# Patient Record
Sex: Female | Born: 1935 | Race: White | Hispanic: No | Marital: Married | Smoking: Former smoker
Health system: Southern US, Community
[De-identification: ages and names within clinical notes are randomized; demographics above are authoritative.]

## PROBLEM LIST (undated history)

## (undated) DIAGNOSIS — I10 Essential (primary) hypertension: Secondary | ICD-10-CM

## (undated) DIAGNOSIS — E785 Hyperlipidemia, unspecified: Secondary | ICD-10-CM

## (undated) DIAGNOSIS — I739 Peripheral vascular disease, unspecified: Secondary | ICD-10-CM

## (undated) HISTORY — DX: Hyperlipidemia, unspecified: E78.5

## (undated) HISTORY — DX: Peripheral vascular disease, unspecified: I73.9

## (undated) HISTORY — DX: Essential (primary) hypertension: I10

---

## 1998-09-18 ENCOUNTER — Other Ambulatory Visit: Admission: RE | Admit: 1998-09-18 | Discharge: 1998-09-18 | Payer: Self-pay | Admitting: Obstetrics & Gynecology

## 1999-09-30 ENCOUNTER — Other Ambulatory Visit: Admission: RE | Admit: 1999-09-30 | Discharge: 1999-09-30 | Payer: Self-pay | Admitting: Obstetrics and Gynecology

## 2004-10-12 ENCOUNTER — Emergency Department: Payer: Self-pay | Admitting: Emergency Medicine

## 2004-10-12 ENCOUNTER — Other Ambulatory Visit: Payer: Self-pay

## 2005-07-14 ENCOUNTER — Ambulatory Visit: Payer: Self-pay | Admitting: Unknown Physician Specialty

## 2006-09-27 ENCOUNTER — Ambulatory Visit: Payer: Self-pay | Admitting: Cardiology

## 2007-11-01 ENCOUNTER — Ambulatory Visit: Payer: Self-pay | Admitting: Unknown Physician Specialty

## 2008-04-17 ENCOUNTER — Ambulatory Visit: Payer: Self-pay

## 2008-04-29 ENCOUNTER — Ambulatory Visit: Payer: Self-pay | Admitting: Vascular Surgery

## 2008-05-22 ENCOUNTER — Ambulatory Visit: Payer: Self-pay | Admitting: Vascular Surgery

## 2008-05-29 ENCOUNTER — Inpatient Hospital Stay: Payer: Self-pay | Admitting: Vascular Surgery

## 2011-08-21 ENCOUNTER — Emergency Department (HOSPITAL_COMMUNITY): Admit: 2011-08-21 | Payer: Self-pay | Source: Home / Self Care

## 2016-10-05 ENCOUNTER — Other Ambulatory Visit: Payer: Self-pay | Admitting: Internal Medicine

## 2016-10-05 DIAGNOSIS — F4489 Other dissociative and conversion disorders: Secondary | ICD-10-CM

## 2016-10-11 ENCOUNTER — Ambulatory Visit
Admission: RE | Admit: 2016-10-11 | Discharge: 2016-10-11 | Disposition: A | Discharge status: Home / Self Care | Payer: Medicare Other | Source: Ambulatory Visit | Attending: Internal Medicine | Admitting: Internal Medicine

## 2016-10-11 DIAGNOSIS — G319 Degenerative disease of nervous system, unspecified: Secondary | ICD-10-CM | POA: Diagnosis not present

## 2016-10-11 DIAGNOSIS — F4489 Other dissociative and conversion disorders: Secondary | ICD-10-CM

## 2016-10-11 DIAGNOSIS — I6782 Cerebral ischemia: Secondary | ICD-10-CM | POA: Insufficient documentation

## 2016-10-20 ENCOUNTER — Other Ambulatory Visit: Payer: Self-pay | Admitting: Internal Medicine

## 2016-10-20 DIAGNOSIS — R9389 Abnormal findings on diagnostic imaging of other specified body structures: Secondary | ICD-10-CM

## 2016-10-28 ENCOUNTER — Ambulatory Visit
Admission: RE | Admit: 2016-10-28 | Discharge: 2016-10-28 | Disposition: A | Discharge status: Home / Self Care | Payer: Medicare Other | Source: Ambulatory Visit | Attending: Internal Medicine | Admitting: Internal Medicine

## 2016-10-28 DIAGNOSIS — J439 Emphysema, unspecified: Secondary | ICD-10-CM | POA: Diagnosis not present

## 2016-10-28 DIAGNOSIS — R918 Other nonspecific abnormal finding of lung field: Secondary | ICD-10-CM | POA: Insufficient documentation

## 2016-10-28 DIAGNOSIS — I2510 Atherosclerotic heart disease of native coronary artery without angina pectoris: Secondary | ICD-10-CM | POA: Insufficient documentation

## 2016-10-28 DIAGNOSIS — R938 Abnormal findings on diagnostic imaging of other specified body structures: Secondary | ICD-10-CM | POA: Diagnosis present

## 2016-10-28 DIAGNOSIS — R9389 Abnormal findings on diagnostic imaging of other specified body structures: Secondary | ICD-10-CM

## 2016-10-28 DIAGNOSIS — I700 Atherosclerosis of aorta: Secondary | ICD-10-CM | POA: Diagnosis not present

## 2016-11-12 ENCOUNTER — Encounter: Payer: Self-pay | Admitting: Pulmonary Disease

## 2016-11-12 ENCOUNTER — Ambulatory Visit (INDEPENDENT_AMBULATORY_CARE_PROVIDER_SITE_OTHER): Payer: Medicare Other | Admitting: Pulmonary Disease

## 2016-11-12 VITALS — BP 100/63 | HR 69 | Ht 61.0 in | Wt 83.0 lb

## 2016-11-12 DIAGNOSIS — R938 Abnormal findings on diagnostic imaging of other specified body structures: Secondary | ICD-10-CM | POA: Diagnosis not present

## 2016-11-12 DIAGNOSIS — R54 Age-related physical debility: Secondary | ICD-10-CM | POA: Diagnosis not present

## 2016-11-12 DIAGNOSIS — J189 Pneumonia, unspecified organism: Secondary | ICD-10-CM

## 2016-11-12 DIAGNOSIS — Z87891 Personal history of nicotine dependence: Secondary | ICD-10-CM

## 2016-11-12 DIAGNOSIS — J181 Lobar pneumonia, unspecified organism: Secondary | ICD-10-CM

## 2016-11-12 DIAGNOSIS — R9389 Abnormal findings on diagnostic imaging of other specified body structures: Secondary | ICD-10-CM

## 2016-11-12 NOTE — Progress Notes (Signed)
PULMONARY CONSULT NOTE  Requesting MD/Service: Thalia Party, MD Date of initial consultation: 11/12/2016 Reason for consultation: RLL infiltrate  PT PROFILE: 83 y.o. female remote smoker referred for evaluation of RLL infiltrate and concern for possible obstruction mass  DATA: CXR 10/05/16 (report only):  RLL infiltrate. F/U CXR in 3-4 weeks recommended CXR 023/13/18 (report only): NSC. CT chest recommended CT chest 10/28/16: Extensive RLL consolidation with suggestion of RLL bronchial obstruction. Stable biapical pleural scarring and bronchiectatic changes Moderate emphysema   HPI:  As above. Her symptoms started. At the end of 2017 with symptoms of an upper respiratory infection and subsequent dyspnea, cough, sputum production. She has been treated with 3 courses of antibiotics. Her symptoms have improved and her cough has resolved. She lost weight during the first part of this illness but is now regaining some of that weight and feels that she is recovering back to her baseline. Presently, she denies CP, fever, purulent sputum, hemoptysis, LE edema and calf tenderness.    Past Medical History:  Diagnosis Date  . Hyperlipidemia   . Hypertension   . Peripheral vascular disease (HCC)     History reviewed. No pertinent surgical history.  MEDICATIONS: I have reviewed all medications and confirmed regimen as documented  Social History   Social History  . Marital status: Married    Spouse name: N/A  . Number of children: N/A  . Years of education: N/A   Occupational History  . Not on file.   Social History Main Topics  . Smoking status: Former Smoker    Quit date: 08/14/1978  . Smokeless tobacco: Never Used  . Alcohol use No  . Drug use: No  . Sexual activity: Not on file   Other Topics Concern  . Not on file   Social History Narrative  . No narrative on file    Family History  Problem Relation Age of Onset  . Family history unknown: Yes    ROS: No fever,  myalgias/arthralgias, unexplained weight loss or weight gain No new focal weakness or sensory deficits No otalgia, hearing loss, visual changes, nasal and sinus symptoms, mouth and throat problems No neck pain or adenopathy No abdominal pain, N/V/D, diarrhea, change in bowel pattern No dysuria, change in urinary pattern   Vitals:   11/12/16 1004  BP: 100/63  Pulse: 69  SpO2: 98%  Weight: 37.6 kg (83 lb)  Height:  (1.549 m)     EXAM:  Gen: Very thin, not cachectic, No overt respiratory distress HEENT: NCAT, sclera white, oropharynx normal Neck: Supple without LAN, thyromegaly, JVD Lungs: breath sounds diminished in R base with slight bronchial quality, no wheezes, rhonchi, rales Cardiovascular: Reg, no murmurs noted Abdomen: Soft, nontender, normal BS Ext: without clubbing, cyanosis, edema Neuro: CNs grossly intact, motor and sensory intact Skin: Limited exam, no lesions noted  DATA:   No flowsheet data found.  No flowsheet data found.  CXR:  Not available for my review  IMPRESSION:     ICD-9-CM ICD-10-CM   1. Lung consolidation (HCC) 481 J18.1 DG Chest 2 View  2. Pneumonia of right lower lobe due to infectious organism (HCC) 486 J18.1   3. Former smoker, remote V15.82 [phone]   4. Abnormal chest CT 793.2 R93.8   5. Advanced age 20 R36    The noncontrasted CT scan is most consistent with PNA but there is certainly reasonable concern re: airway obstruction. Her improving symptoms are somewhat reassuring however, there are persistent exam findings consistent with RLL  consolidation. If a malignancy were to be found, there would be very limited therapeutic options  PLAN:  Follow up in 4 weeks with repeat chest Xray prio to that visit.  At that time, depending on what the chest Xray shows, we will consider repeat CT scan of chest and possible bronchoscopy   Billy Fischer, MD PCCM service Mobile [phone] Pager [phone] 11/12/2016

## 2016-11-12 NOTE — Patient Instructions (Addendum)
Since you seem to be getting better, we will not undertake any diagnostic studies right now. Follow up with me in 4 weeks with repeat chest Xray. At that time, depending on what the chest Xray shows, we will consider repeat CT scan of chest and possible bronchoscopy to evaluate your airways  Follow up in approximately one month

## 2016-12-15 ENCOUNTER — Encounter: Payer: Self-pay | Admitting: Pulmonary Disease

## 2016-12-15 ENCOUNTER — Ambulatory Visit (INDEPENDENT_AMBULATORY_CARE_PROVIDER_SITE_OTHER): Payer: Medicare Other | Admitting: Pulmonary Disease

## 2016-12-15 ENCOUNTER — Ambulatory Visit
Admission: RE | Admit: 2016-12-15 | Discharge: 2016-12-15 | Disposition: A | Discharge status: Home / Self Care | Payer: Medicare Other | Source: Ambulatory Visit | Attending: Pulmonary Disease | Admitting: Pulmonary Disease

## 2016-12-15 VITALS — BP 146/80 | HR 66 | Ht 61.0 in | Wt 84.0 lb

## 2016-12-15 DIAGNOSIS — J181 Lobar pneumonia, unspecified organism: Secondary | ICD-10-CM | POA: Diagnosis present

## 2016-12-15 DIAGNOSIS — R918 Other nonspecific abnormal finding of lung field: Secondary | ICD-10-CM

## 2016-12-15 NOTE — Patient Instructions (Addendum)
We will obtain a repeat CT scan of chest and review this together at next visit  Follow up 06/11 @ 12:00

## 2016-12-15 NOTE — Progress Notes (Signed)
PULMONARY OFFICE FOLLOW-UP NOTE  Requesting MD/Service: Thalia PartyB Klein, MD Date of initial consultation: 12/15/2016 Reason for consultation: RLL infiltrate  PT PROFILE: 83 y.o. female remote smoker referred for evaluation of RLL infiltrate and concern for possible obstruction mass  DATA: CXR 10/05/16 (report only):  RLL infiltrate. F/U CXR in 3-4 weeks recommended CXR 023/13/18 (report only): NSC. CT chest recommended CT chest 10/28/16: Extensive RLL consolidation with suggestion of RLL bronchial obstruction. Stable biapical pleural scarring and bronchiectatic changes Moderate emphysema CXR 12/15/16: Persistent right lower lobe opacity. When compared to the scout film on CT scan of the chest it appears to be improved but not resolved.   SUBJ:  No new complaints. She denies chest pain, cough, hemoptysis, fevers, weight loss.  OBJ:  Vitals:   12/15/16 0858  BP: (!) 146/80  Pulse: 66  SpO2: 98%  Weight: 84 lb (38.1 kg)  Height: 5\' 1"  (1.549 m)     EXAM:  Gen: Very thin, not cachectic, No overt respiratory distress HEENT: NCAT, sclera white, oropharynx normal Neck: Supple without LAN, thyromegaly, JVD Lungs: Slightly diminished breath sounds in R base, otherwise normal Cardiovascular: Reg, no murmurs noted Abdomen: Soft, nontender, normal BS Ext: without clubbing, cyanosis, edema Neuro: CNs grossly intact, motor and sensory intact Skin: Limited exam, no lesions noted  DATA:   No flowsheet data found.  No flowsheet data found.  CXR:  Not available for my review  IMPRESSION:     ICD-9-CM ICD-10-CM   1. Opacity of lung on imaging study 793.19 R91.8 CT CHEST WO CONTRAST   Persistent opacity in the right lower lobe. It is hard to tell but I think that there has been some improvement over time. Unfortunately, I do not have access to view the previously performed chest x-rays. Therefore, I am comparing the current film to the scout film on the CT scan of the chest.  PLAN:  Repeat  CT of chest without contrast has been ordered Follow-up scheduled 06/11 at noon to review that CT scan and decide on the next course of action   Billy Fischeravid Delvina Mizzell, MD PCCM service Mobile [phone] Pager [phone] 12/15/2016  .Marland Kitchen..Marland Kitchen

## 2017-01-13 ENCOUNTER — Ambulatory Visit
Admission: RE | Admit: 2017-01-13 | Discharge: 2017-01-13 | Disposition: A | Discharge status: Home / Self Care | Payer: Medicare Other | Source: Ambulatory Visit | Attending: Pulmonary Disease | Admitting: Pulmonary Disease

## 2017-01-13 DIAGNOSIS — J439 Emphysema, unspecified: Secondary | ICD-10-CM | POA: Diagnosis not present

## 2017-01-13 DIAGNOSIS — R918 Other nonspecific abnormal finding of lung field: Secondary | ICD-10-CM | POA: Diagnosis present

## 2017-01-13 DIAGNOSIS — J470 Bronchiectasis with acute lower respiratory infection: Secondary | ICD-10-CM | POA: Insufficient documentation

## 2017-01-13 DIAGNOSIS — I700 Atherosclerosis of aorta: Secondary | ICD-10-CM | POA: Insufficient documentation

## 2017-01-17 ENCOUNTER — Encounter: Payer: Self-pay | Admitting: Pulmonary Disease

## 2017-01-17 ENCOUNTER — Ambulatory Visit (INDEPENDENT_AMBULATORY_CARE_PROVIDER_SITE_OTHER): Payer: Medicare Other | Admitting: Pulmonary Disease

## 2017-01-17 VITALS — BP 148/88 | HR 88 | Ht 61.0 in | Wt 85.0 lb

## 2017-01-17 DIAGNOSIS — J479 Bronchiectasis, uncomplicated: Secondary | ICD-10-CM | POA: Diagnosis not present

## 2017-01-17 DIAGNOSIS — R636 Underweight: Secondary | ICD-10-CM

## 2017-01-17 DIAGNOSIS — R918 Other nonspecific abnormal finding of lung field: Secondary | ICD-10-CM | POA: Diagnosis not present

## 2017-01-17 MED ORDER — FLUTTER DEVI: 0 refills | Status: AC

## 2017-01-17 NOTE — Patient Instructions (Signed)
Begin flutter valve - [facility]-4 times per day We will try to obtain a chest percussion vest to be used twice a day for 10-15 minutes per session I encouraged weight gain as discussed Follow-up in 2-3 months with chest x-ray

## 2017-01-18 NOTE — Progress Notes (Signed)
PULMONARY OFFICE FOLLOW-UP NOTE  Requesting MD/Service: Caryn Section, MD Date of initial consultation: 01/18/2017 Reason for consultation: RLL infiltrate  PT PROFILE: 83 y.o. female remote smoker referred for evaluation of RLL infiltrate and concern for possible obstruction mass  DATA: CXR 10/05/16 (report only):  RLL infiltrate. F/U CXR in 3-4 weeks recommended CXR 023/13/18 (report only): [place]. CT chest recommended CT chest 10/28/16: Extensive RLL consolidation with suggestion of RLL bronchial obstruction. Stable biapical pleural scarring and bronchiectatic changes Moderate emphysema CXR 12/15/16: Persistent right lower lobe opacity. When compared to the scout film on CT scan of the chest it appears to be improved but not resolved.   SUBJ:  Here to review recent CT chest. No new complaints. She denies chest pain, cough, hemoptysis, fevers, weight loss.  OBJ:  Vitals:   01/17/17 1425  BP: (!) 148/88  Pulse: 88  SpO2: 97%  Weight: 38.6 kg (85 lb)  Height: _0  (1.549 m)     EXAM:  Gen: Very thin, No overt respiratory distress HEENT: NCAT, sclera white, oropharynx normal Neck: Supple without LAN, thyromegaly, JVD Lungs: Few rhonchi and diminished breath sounds in R base Cardiovascular: Reg, no murmurs noted Abdomen: Soft, nontender, normal BS Ext: without clubbing, cyanosis, edema Neuro: CNs grossly intact, motor and sensory intact Skin: Limited exam, no lesions noted  DATA:   No flowsheet data found.  No flowsheet data found.  CT chest 01/13/17: (reviewed by me) Right lower lobe confluent plugging of cylindrical bronchiectasis, involving all segmental airways except the apical segment. No noncontrast evidence of underlying obstructive process, although bronchoscopy or postcontrast imaging would be more sensitive. Chronic atypical infection (MAC) is considered given milder bronchovascular nodularity in the right upper lobe and lingula. Superimposed pneumonia/consolidation has  improved since 10/28/2016 chest CT.  IMPRESSION:     ICD-10-CM   1. Bronchiectasis without complication ([place]) O67.6 DG Chest 2 View    Ambulatory Referral for DME  2. Pulmonary infiltrate R91.8   3. Underweight R63.6    I do not think that she has endobronchial obstruction but, rather, extensive bronchiectasis in RLL and to a much lesser extent in BUL The RLL consolidation has improved somewhat. She is notably asymptomatic. However, chronic inflammation might be contributing to her tendency to be underweight  She would benefit from aggressive mucus clearance to open up that RLL and to reduce her likelihood of repeated pneumonias and exacerbations of bronchiectasis. She lives with her elderly husband who is not able to perform manual chest percussion. The optimal and only realistic option is chest percussion vest. I strongly believe that this is a necessary therapy to provide her with optimal airway hygiene.   PLAN:  Begin flutter valve 3-4 times per day. Use demonstrated in office during this encounter Chest percussion vest ordered - to be used twice a day for 10-15 minutes per session I encouraged weight gain of 5-10 lbs to provide her with more reserve in event of illness Follow-up in 2-3 months with chest x-ray  Merton Border, MD PCCM service Mobile [phone] Pager [phone] 01/18/2017 8:25 PM   ..Marland Kitchen

## 2017-01-27 ENCOUNTER — Telehealth: Payer: Self-pay | Admitting: Pulmonary Disease

## 2017-01-27 NOTE — Telephone Encounter (Signed)
Kathlene NovemberMike with [employer] ([employer]) called and stated that company has made contact with patient. Spoke with pt's husband who stated that "pt has refused the vest and does not want to even try the vest".    Kathlene NovemberMike with [employer] stated that they could arrange a 30 day trial and if patient didn't see any benefits from it then they would arrange to have the vest picked up at no charge to the patient.   However, per husband, the patient has early Alzheimer's Disease and when "she says no, she means no".    Please advise if you would like to cancel this referral.  Thank you . Rhonda J Cobb

## 2017-03-24 ENCOUNTER — Ambulatory Visit: Payer: Medicare Other | Admitting: Pulmonary Disease

## 2018-06-17 IMAGING — CT CT HEAD W/O CM
2 series · 15 of 30 positions shown, 17 images · non-contrast
Comparison: None.

CLINICAL DATA: Confusion, disoriented for about 1 month

EXAM:
CT HEAD WITHOUT CONTRAST
TECHNIQUE: Contiguous axial images were obtained from the base of the skull
through the vertex without intravenous contrast.

[Series 2: head wo · axial · 0.41mm/px · z∈[-128,-18]mm · 7 of 30 slices shown, 9 images]
[im 4/30  brain]
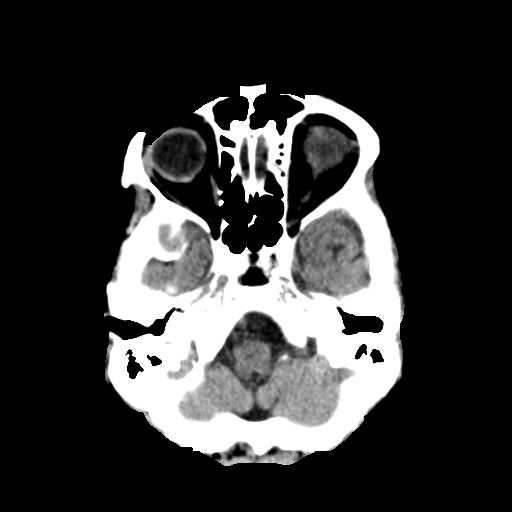
[im 4/30  bone]
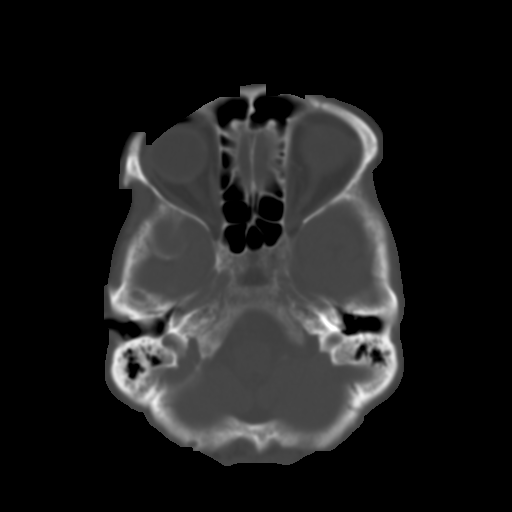
[im 8/30  brain]
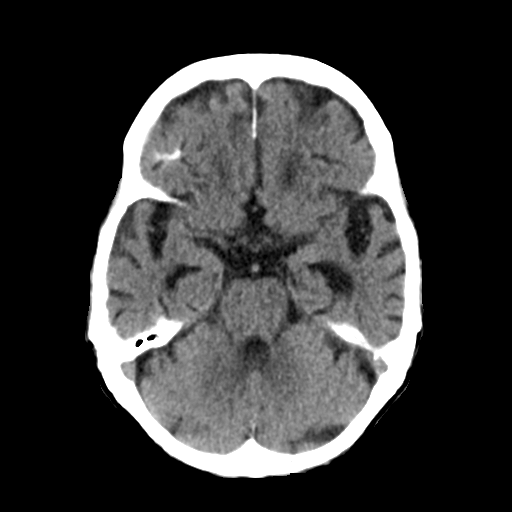
[im 11/30  brain]
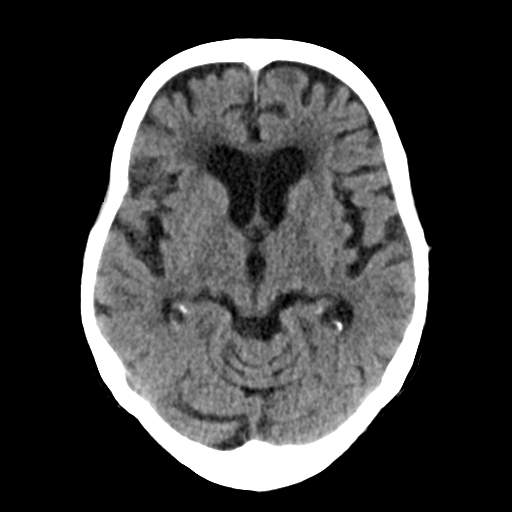
[im 15/30  brain]
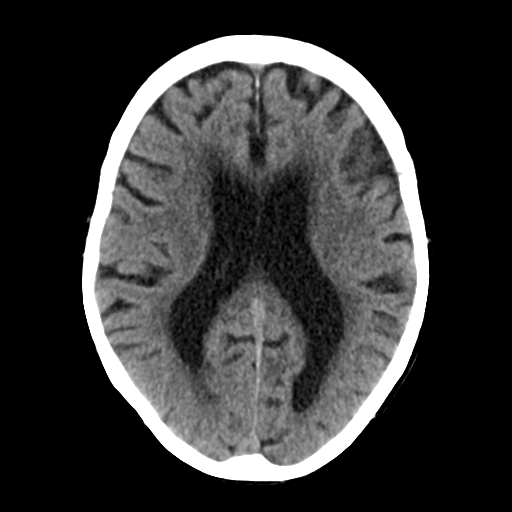
[im 19/30  brain]
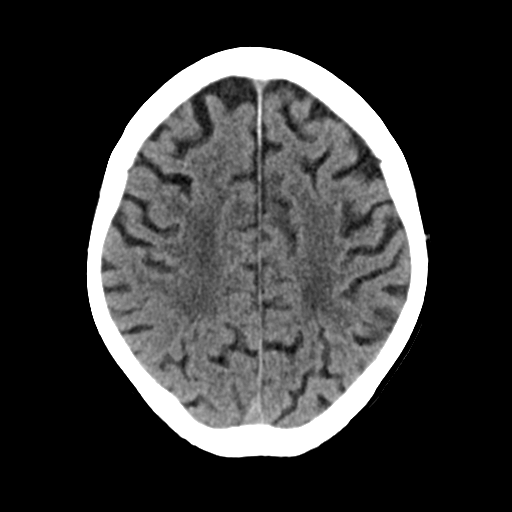
[im 19/30  bone]
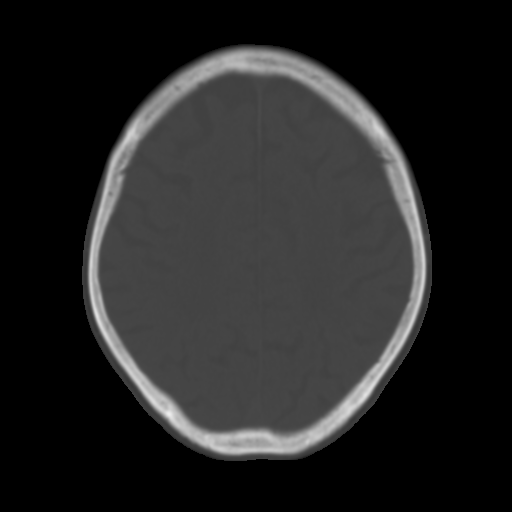
[im 22/30  brain]
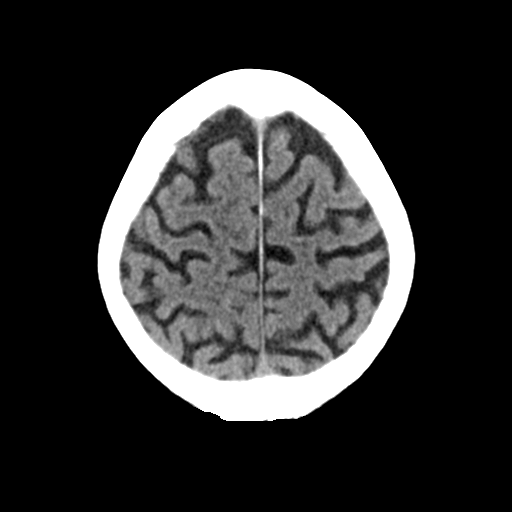
[im 26/30  brain]
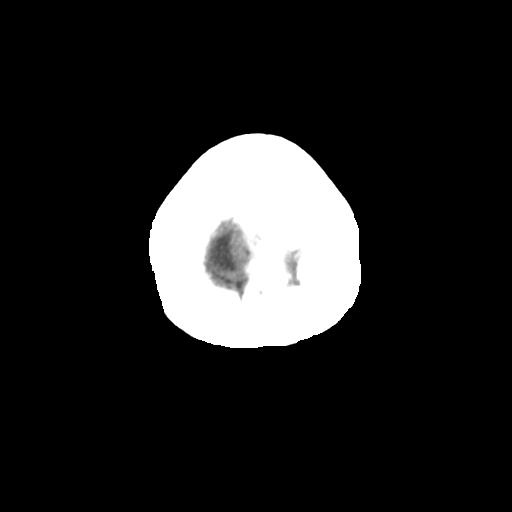

[Series 3: head bone · axial · 0.41mm/px · z∈[-129,-11]mm · 8 of 75 slices shown]
[im 8/75  bone]
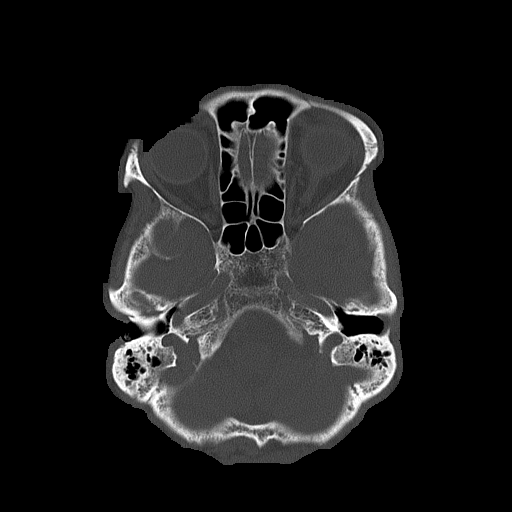
[im 15/75  bone]
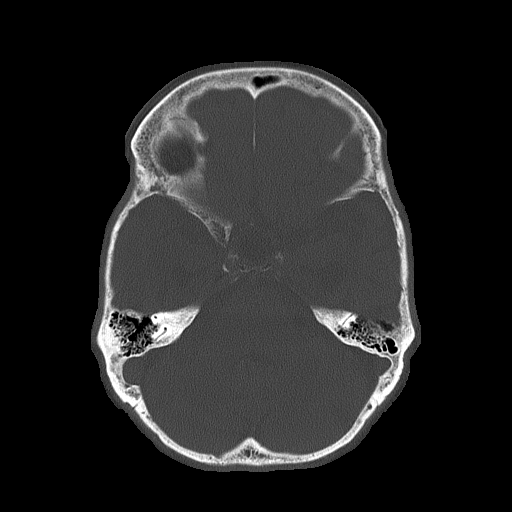
[im 23/75  bone]
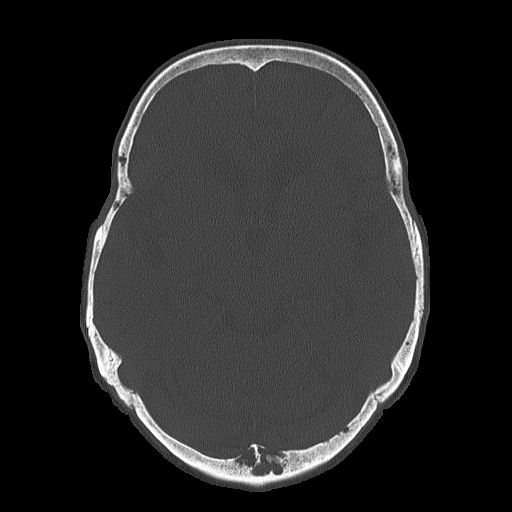
[im 34/75  bone]
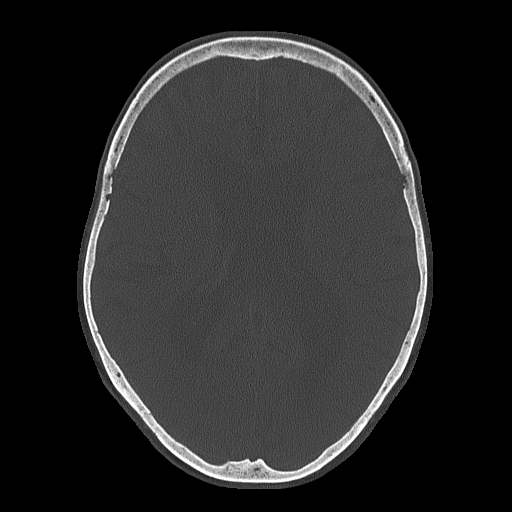
[im 41/75  bone]
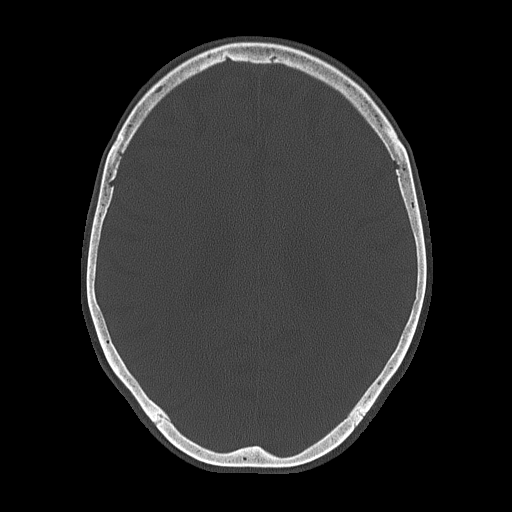
[im 52/75  bone]
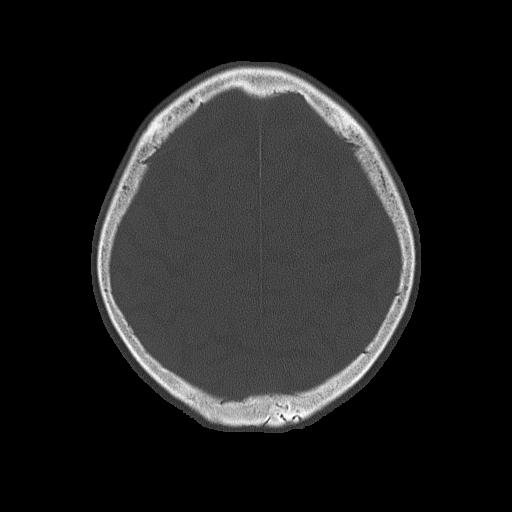
[im 60/75  bone]
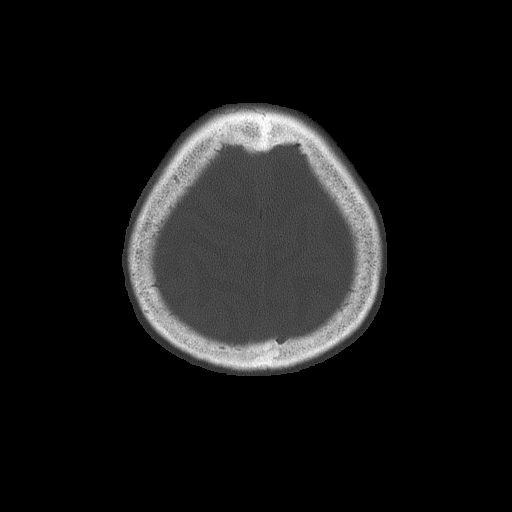
[im 67/75  bone]
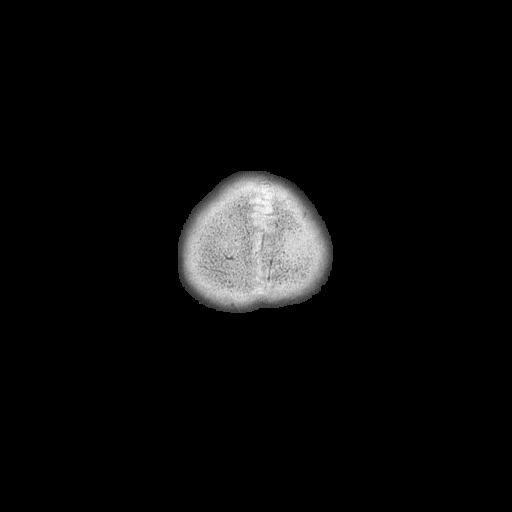

[15 of 30 positions shown; findings below may reference images not displayed]

FINDINGS: Brain: No intracranial hemorrhage, mass effect or midline shift.
Moderate cerebral atrophy. There is mild periventricular and patchy
subcortical white matter decreased attenuation probable due to
chronic small vessel ischemic changes. No acute cortical infarction.
No mass lesion is noted on this unenhanced scan.

Vascular: Atherosclerotic calcifications are noted carotid siphon.

Skull: No skull fracture.

Sinuses/Orbits: No acute findings

Other: None
IMPRESSION: 1. No acute intracranial abnormality. Moderate cerebral atrophy.
There is periventricular and patchy subcortical white matter
decreased attenuation probable due to chronic small vessel ischemic
changes. No definite acute cortical infarction.

## 2019-09-03 ENCOUNTER — Telehealth: Payer: Self-pay

## 2019-09-03 NOTE — Telephone Encounter (Signed)
Communication with patient's son today regarding Hospice and Palliative care services. Provided education regarding the difference in Hospice and Palliative care. Patient's son would like to pursue Palliative care for patient and requested that Dr. Odessa Fleming office be contacted for approval.

## 2019-09-03 NOTE — Telephone Encounter (Signed)
Received phone call from PCP office conirminng that Dr. Graciela Husbands is in agreement with initiaitig palliative care for patient. Patient's son updated

## 2019-09-03 NOTE — Telephone Encounter (Signed)
At the request of patient's son, phone call placed to Dr. Odessa Fleming office to inquire if Dr. Graciela Husbands is in agreement with initiating Palliative care services. Message given to [facility], with call back information.

## 2019-09-04 ENCOUNTER — Telehealth: Payer: Self-pay | Admitting: Adult Health Nurse Practitioner

## 2019-09-04 NOTE — Telephone Encounter (Signed)
Spoke with patient's son [name] regarding Palliative services and we have scheduled a Telephone Consult for 09/11/19 @ 9 AM.

## 2019-09-11 ENCOUNTER — Other Ambulatory Visit: Payer: Medicare Other | Admitting: Adult Health Nurse Practitioner

## 2019-09-11 ENCOUNTER — Other Ambulatory Visit: Payer: Self-pay

## 2019-09-11 DIAGNOSIS — F0281 Dementia in other diseases classified elsewhere with behavioral disturbance: Secondary | ICD-10-CM

## 2019-09-11 DIAGNOSIS — Z515 Encounter for palliative care: Secondary | ICD-10-CM

## 2019-09-11 DIAGNOSIS — G301 Alzheimer's disease with late onset: Secondary | ICD-10-CM

## 2019-09-11 NOTE — Progress Notes (Signed)
Therapist, nutritional Palliative Care Consult Note Telephone: [phone]  Fax: [phone]  PATIENT NAME: Ashlee Briggs DOB: [birth year] MRN: [id number]  PRIMARY CARE PROVIDER:   Lynnea Ferrier, MD  REFERRING PROVIDER:  Lynnea Ferrier, MD [address] [facility] [place],  [place] [address]  RESPONSIBLE PARTY:   Quintasia Theroux, husband [phone] Stephen Turnbaugh, son [phone]  Due to the COVID-19 crisis, this visit was done via telemedicine and it was initiated and consent by this patient and or family. Video-audio (telehealth) contact was unable to be done due to technical barriers from the patient's side.    RECOMMENDATIONS and PLAN:  1.  Advanced care planning.  Patient is a full code.  Have in person appointment to go over in one week and to evaluate patient's baseline.  2.  Alzheimer's dementia. FAST 6d.  Patient is able to ambulate without assistive devices but does require stand by assist at times. Denies falls. She is incontinent of B&B.  She requires assistance with bathing and dressing.  Family is in the process of getting extra help in the home.  Have discussed questions they can ask the agency when bringing an aide into the home to help with a dementia patient.  Patient lives in [place] with her husband.  Family does state that over the past couple of months it has been getting harder to care for her as she is starting to require more assistance and that is why they are pursuing extra help in the home.  Discussed disease progression and what to expect.  Patient does have good days and bad days with her appetite.  Explained that this can be normal with dementia and that forcing the issue could increase agitation.  She does have periods of anxiety/agitation. Family states that she can be "mean" at times when being told what to do. Takes Namenda 10 mg BID.  Was started on Abilify 2mg  daily.  Husband was giving in the morning and this caused  sleepiness.  Tried giving half a tab at night and she was still having daytime sleepiness and unsteady gait.  Husband has stopped giving her this after 2 weeks of not seeing any improvement in the daytime sleepiness and unsteady gait.  Agree for patient's safety that it was probably best not to continue with the Abilify.  Did suggest that if in the future if she needs something for agitation we could try seroquel.  No matter what we try will have to monitor for excessive drowsiness and risk of falls.    3.  Nutrition.  Family reports that patient has always been small but a couple years ago when first found to have bronchiectasis that she lost down to low 80 pounds.  She was started on megace and takes 20 mg daily.  Her weight has been stable around 99 pounds.  Continue to monitor for weight loss and add supplementation if needed.  Palliative care will continue to monitor for symptom management/decline and make recommendations as needed.  Have offered that if need be I can be there when the aide is there to help with any dementia education  I spent 60 minutes providing this consultation,  including time with patient/family, chart review, provider coordination, and documentation. More than 50% of the time in this consultation was spent coordinating communication.   HISTORY OF PRESENT ILLNESS:  [facility]lee [facility]gs is a 86 y.o. year old female with multiple medical problems including late onset Alzheimer's, bronchiectasis, HTN,  PVD, CHF. Palliative Care was asked to help address goals of care.   CODE STATUS: full code  PPS: 60% HOSPICE ELIGIBILITY/DIAGNOSIS: TBD  PHYSICAL EXAM:   Deferred  PAST MEDICAL HISTORY:  Past Medical History:  Diagnosis Date  . Hyperlipidemia   . Hypertension   . Peripheral vascular disease ([place])     SOCIAL HX:  Social History   Tobacco Use  . Smoking status: Former Smoker    Quit date: 08/14/1978    Years since quitting: 41.1  . Smokeless tobacco: Never Used    Substance Use Topics  . Alcohol use: No    ALLERGIES:  Allergies  Allergen Reactions  . Alendronate Other (See Comments)  . Risedronate Other (See Comments)  . Sulfa Antibiotics Other (See Comments)     PERTINENT MEDICATIONS:  Outpatient Encounter Medications as of 09/11/2019  Medication Sig  . Ascorbic Acid (VITAMIN C) 1000 MG tablet Take 1,000 mg by mouth daily.  . carvedilol (COREG) 25 MG tablet Take 25 mg by mouth 2 (two) times daily with a meal.  . Cholecalciferol (D3 HIGH POTENCY) 2000 units CAPS Take 2,000 Units by mouth daily.  . Multiple Vitamin (MULTIVITAMIN) tablet Take 1 tablet by mouth daily.  Marland Kitchen Respiratory Therapy Supplies (FLUTTER) DEVI Use 10-15 times daily  . simvastatin (ZOCOR) 40 MG tablet Take 40 mg by mouth daily.   No facility-administered encounter medications on file as of 09/11/2019.      Marcine Gadway Jenetta Downer, NP

## 2019-09-13 ENCOUNTER — Telehealth: Payer: Self-pay | Admitting: Adult Health Nurse Practitioner

## 2019-09-13 NOTE — Telephone Encounter (Signed)
Returned son's VM.  Setting up in home assistance and had questions about how often the aide should come in. He and his dad having differences of opinion and he wanted more clarity on what would work best. Did express that with a dementia patient consistency is best.If they could have someone come in 5 days a week versus someone coming in 3 days a week his mom may eventually establish a better relationship with the aide. Aide through [facility] is starting on Monday. Encouraged to call with questions and concerns. Prosper Paff K. Garner Nash NP

## 2019-09-19 ENCOUNTER — Other Ambulatory Visit: Payer: Medicare Other | Admitting: Adult Health Nurse Practitioner

## 2019-09-19 ENCOUNTER — Other Ambulatory Visit: Payer: Self-pay

## 2019-09-19 ENCOUNTER — Telehealth: Payer: Self-pay

## 2019-09-19 DIAGNOSIS — F0281 Dementia in other diseases classified elsewhere with behavioral disturbance: Secondary | ICD-10-CM

## 2019-09-19 DIAGNOSIS — Z515 Encounter for palliative care: Secondary | ICD-10-CM

## 2019-09-19 DIAGNOSIS — G301 Alzheimer's disease with late onset: Secondary | ICD-10-CM

## 2019-09-19 NOTE — Telephone Encounter (Signed)
At the direction of Amy NP, phone call placed to Dr. Odessa Fleming office to request an order for speech therapy to provide cognitive eval.

## 2019-09-19 NOTE — Progress Notes (Signed)
[facility] Consult Note Telephone: [phone]  Fax: [phone]  PATIENT NAME: Ashlee Briggs DOB: 84-01-1928 MRN: [id number]  PRIMARY CARE PROVIDER:   Adin Hector, MD  REFERRING PROVIDER:  Adin Hector, MD [facility] [place],  [place] [facility]  RESPONSIBLE PARTY:   Ashlee Briggs, husband [phone] Ashlee [facility]gs, son [phone]      RECOMMENDATIONS and PLAN:  1.  Advanced care planning.  Started discussion on ACP.  Family states that they have a living will.  Left blank MOST form with family to go over and will go over at next visit scheduled in 4 weeks.  2.  Alzheimer's dementia. FAST 6d.  Patient is able to ambulate without assistive devices but does require stand by assist at times. Denies falls. She is incontinent of bladder and occasional incontinence of bowel.  She requires assistance with bathing and dressing.  They have found an aide through [facility] who comes in 5 days a week from 9am to 12pm.  Patient and aide are working well together and this is giving husband and family the help they need and peace of mind to be able to go out and take care of themselves.  They are concerned about keeping her mind stimulated.  She used to go to an adult daycare at [facility] to [employer] and has not had that stimulation for almost a year now.  Have reached out to PCP for referral for home health [facility] to provide memory exercises.  3.  Nutrition.  Patient's weight has been stable around 99 pounds.  Her tastes have changed and she tends to eat better if they start a meal with something sweet.  Encouraged too to offer milk shakes mixed with Ensure or Boost for extra nutritional supplementation.  Will continue to monitor for weight loss.   Palliative care will continue to monitor for symptom management/decline and make recommendations as needed.   I spent 50 minutes providing this consultation,  including time with patient/family, chart review, provider coordination, and documentation . More than 50% of the time in this consultation was spent coordinating communication.   HISTORY OF PRESENT ILLNESS:  Ashlee Briggs is a 86 y.o. year old female with multiple medical problems including late onset Alzheimer's, bronchiectasis, HTN, PVD, CHF. Palliative Care was asked to help address goals of care.   CODE STATUS: see above  PPS: 60% HOSPICE ELIGIBILITY/DIAGNOSIS: TBD  PHYSICAL EXAM:  BP 118/62  HR 76  O2 95% on RA General: NAD, frail appearing, thin Cardiovascular: regular rate and rhythm Pulmonary: lung sounds clear; normal respiratory effort Abdomen: soft, nontender, + bowel sounds Extremities: no edema, no joint deformities Skin: no rashes on exposed Neurological: Weakness; A&O to person and place   PAST MEDICAL HISTORY:  Past Medical History:  Diagnosis Date  . Hyperlipidemia   . Hypertension   . Peripheral vascular disease ([place])     SOCIAL HX:  Social History   Tobacco Use  . Smoking status: Former Smoker    Quit date: 08/14/1978    Years since quitting: 41.1  . Smokeless tobacco: Never Used  Substance Use Topics  . Alcohol use: No    ALLERGIES:  Allergies  Allergen Reactions  . Alendronate Other (See Comments)  . Risedronate Other (See Comments)  . Sulfa Antibiotics Other (See Comments)     PERTINENT MEDICATIONS:  Outpatient Encounter Medications as of 09/19/2019  Medication Sig  . Ascorbic Acid (  VITAMIN C) 1000 MG tablet Take 1,000 mg by mouth daily.  . carvedilol (COREG) 25 MG tablet Take 25 mg by mouth 2 (two) times daily with a meal.  . Cholecalciferol (D3 HIGH POTENCY) 2000 units CAPS Take 2,000 Units by mouth daily.  . Multiple Vitamin (MULTIVITAMIN) tablet Take 1 tablet by mouth daily.  Marland Kitchen Respiratory Therapy Supplies (FLUTTER) DEVI Use 10-15 times daily  . simvastatin (ZOCOR) 40 MG tablet Take 40 mg by mouth daily.   No facility-administered  encounter medications on file as of 09/19/2019.     Ashlee Velarde Marlena Clipper, NP

## 2019-10-18 ENCOUNTER — Other Ambulatory Visit: Payer: Self-pay

## 2019-10-18 ENCOUNTER — Other Ambulatory Visit: Payer: Medicare Other | Admitting: Adult Health Nurse Practitioner

## 2019-10-18 DIAGNOSIS — F02818 Dementia in other diseases classified elsewhere, unspecified severity, with other behavioral disturbance: Secondary | ICD-10-CM

## 2019-10-18 DIAGNOSIS — Z515 Encounter for palliative care: Secondary | ICD-10-CM

## 2019-10-18 DIAGNOSIS — F0281 Dementia in other diseases classified elsewhere with behavioral disturbance: Secondary | ICD-10-CM

## 2019-10-18 NOTE — Progress Notes (Signed)
Therapist, nutritional Palliative Care Consult Note Telephone: [phone]  Fax: [phone]  PATIENT NAME: Ashlee Briggs DOB: 01-15-1928 MRN: [id number]  PRIMARY CARE PROVIDER:   Lynnea Ferrier, MD  REFERRING PROVIDER:  Lynnea Ferrier, MD [address] [facility] [place],  [place] [address]  RESPONSIBLE PARTY:   Ashlee Briggs, husband [phone] Ashlee Briggs, son [phone]    RECOMMENDATIONS and PLAN:  1.  Advanced care planning.  Patient is a DNR.  Went over at [employer] form.  Husband did not want to fill this out today.  Did have a good discussion about what he wanted for his wife including being treated at hospital but not wanting too aggressive or invasive treatment.    2.  Alzheimer's dementia. FAST 6d. Patient is able to ambulate without assistive devices but does require stand by assist at times. Denies falls. She is incontinent of bladder and occasional incontinence of bowel. She requires assistance with bathing and dressing.  They have found 2 aides through [facility] who comes in 5 days a week from 9am to 12pm.  One comes in on M,W,F and the other on T, TH.  This is working out well. The aides will read to her and the patient will discuss the stories with the aide.  She is done working with therapy and husband states that ST has given him some exercises to help get her mind engaged.  Continue supportive care at home.  Patient has not had any falls, infection, or hospital visits since last visit.  Appetite is the same and weight stable.  Palliative care will continue to monitor for symptom management/decline and make recommendations as needed.   I spent 90 minutes providing this consultation,  from 2:00 to 3:30 including time with patient/family, chart review, provider coordination, and documentation. More than 50% of the time in this consultation was spent coordinating communication.   HISTORY OF PRESENT ILLNESS:  Ashlee Briggs is a 86 y.o. year old female with multiple medical problems including late onset Alzheimer's, bronchiectasis, HTN, PVD, CHF. Palliative Care was asked to help address goals of care.   CODE STATUS: DNR  PPS: 60% HOSPICE ELIGIBILITY/DIAGNOSIS: TBD  PHYSICAL EXAM:  BP  122/70  Weight 100 pounds General: NAD, frail appearing, thin Cardiovascular: regular rate and rhythm Pulmonary: lung sounds clear; normal respiratory effort Abdomen: soft, nontender, + bowel sounds GU: no suprapubic tenderness Extremities: no edema, no joint deformities Skin: no rashes on exposed skin Neurological: Weakness; A&O to person and place  PAST MEDICAL HISTORY:  Past Medical History:  Diagnosis Date  . Hyperlipidemia   . Hypertension   . Peripheral vascular disease (HCC)     SOCIAL HX:  Social History   Tobacco Use  . Smoking status: Former Smoker    Quit date: 08/14/1978    Years since quitting: 41.2  . Smokeless tobacco: Never Used  Substance Use Topics  . Alcohol use: No    ALLERGIES:  Allergies  Allergen Reactions  . Alendronate Other (See Comments)  . Risedronate Other (See Comments)  . Sulfa Antibiotics Other (See Comments)     PERTINENT MEDICATIONS:  Outpatient Encounter Medications as of 10/18/2019  Medication Sig  . Ascorbic Acid (VITAMIN C) 1000 MG tablet Take 1,000 mg by mouth daily.  . carvedilol (COREG) 25 MG tablet Take 25 mg by mouth 2 (two) times daily with a meal.  . Cholecalciferol (D3 HIGH POTENCY) 2000 units CAPS Take 2,000 Units  by mouth daily.  . Multiple Vitamin (MULTIVITAMIN) tablet Take 1 tablet by mouth daily.  Marland Kitchen Respiratory Therapy Supplies (FLUTTER) DEVI Use 10-15 times daily  . simvastatin (ZOCOR) 40 MG tablet Take 40 mg by mouth daily.   No facility-administered encounter medications on file as of 10/18/2019.      Ashlee Briggs Jenetta Downer, NP

## 2019-10-29 ENCOUNTER — Telehealth: Payer: Self-pay

## 2019-10-29 ENCOUNTER — Telehealth: Payer: Self-pay | Admitting: Adult Health Nurse Practitioner

## 2019-10-29 NOTE — Telephone Encounter (Signed)
SW received referral from Amy, NP regarding long-term care questions and concerns. SW attempted to contact [name] (patient's son). SW left VM with contact information. Jimmy contacted SW. Discussed situation in that patient's husband was caring for patient but has had a stroke and family realizes that patient and patient's husband are needing additional care support. [name] said that they have a plan for the short-term but would like to know options and process for assisted living. SW discussed process and provided education. SW agreed to also email information. SW provided a list of assisted living facilities in [facility] for family to review, contact and discuss. [name] was appreciative. SW provided contact information for any additional follow-up questions.

## 2019-10-29 NOTE — Telephone Encounter (Signed)
Returned patients message.  His father, patient's husband and primary caregiver, is in the hospital after a CVA.  They do have 9am-6pm caregivers lined up and he or his sister stay with their mother at night.  Had questions about possible placement as his dad post CVA will not be able to take care of her like he used to.  Is interested in placement for both of his parents with his mother needing place with memory care unit.  Discussed having SW contact him with more information.  States that his father will be discharged either today or tomorrow.  Have contacted SW to reach out to the son with more information on what next steps would be for starting the process of placement. Tynslee Bowlds K. Garner Nash  NP

## 2019-11-27 ENCOUNTER — Telehealth: Payer: Self-pay | Admitting: Adult Health Nurse Practitioner

## 2019-11-27 NOTE — Telephone Encounter (Signed)
Spoke with son about appointment scheduled for 11/29/19.  States that his mom and his dad will both be moving into [facility] [address] in [place] within a week.  Son would like to continue palliative services for his mom and start them for his dad at the facility. Cancelled appt on 4/22 and have reached out to palladmin to continue services at [facility] and to start palliative services for his dad as well as he has had a decline since having a stroke about 3 weeks ago Sahaj Bona K. Garner Nash NP

## 2019-12-06 ENCOUNTER — Telehealth: Payer: Self-pay

## 2019-12-06 NOTE — Telephone Encounter (Signed)
At request of patient's son, message sent to [facility] to request for Palliative Care to continue to follow patient at [facility].

## 2020-01-08 ENCOUNTER — Encounter: Payer: Self-pay | Admitting: Internal Medicine

## 2020-01-08 ENCOUNTER — Other Ambulatory Visit: Payer: Self-pay

## 2020-01-08 ENCOUNTER — Non-Acute Institutional Stay: Payer: Medicare Other | Admitting: Internal Medicine

## 2020-01-08 DIAGNOSIS — Z7189 Other specified counseling: Secondary | ICD-10-CM

## 2020-01-08 DIAGNOSIS — Z515 Encounter for palliative care: Secondary | ICD-10-CM

## 2020-01-08 NOTE — Progress Notes (Signed)
01/08/2020 [facility] Palliative Care Consult Note Telephone: [phone]  Fax: [phone]  PATIENT NAME: Ashlee Briggs DOB: Jan 25, 1928 MRN: [id number]  PRIMARY CARE PROVIDER:   Cheryll Cockayne, MD   [place] [address]   [facility] [place], [place] [address]   [phone]   [phone] (Fax)   REFERRING PROVIDER:  Clide Deutscher, NP RESPONSIBLE PARTY: Zenna, Traister (Son) [phone] [facility]). [name] ([phone]. Daisja, Kessinger [phone]  ASSESSMENT / RECOMMENDATIONS:   1. Advance Care Planning: A. Directives: discussed and confirmed with PCG spouse [name]. Patient is a DNR. Form available in facility chart. I uploaded into [facility] EMR.    B. Goals of Care: For patient to be content and happy under facility memory care.  2. Cognitive / Functional status: FAST 7a.   Spouse reports rapid cognitive decline. 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 Currently totally confused. She does recognize her spouse and children. She is unable to participate in facility activities. 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 Spouse notes she monitors the activities of other residents; believes patient thinks she is caring for these residents in some way. Patient repeats the same last words of her short phrases and sentences. Maintains eye contact, pleasant. Staff report element of insomnia. She doesn't take naps during the day. Can exchange some pleasantries but cannot speak on topic.    Sedentary. Able to self-transfer. No falls. Ambulates independently but benefits from standby assist. Doubt she could cognate using walker to steady herself. She is dependent for dressing and hygiene. She is incontinent of bladder and bowel unless toileted q 2 hr. Spouse reports good appetite (improved on Megace) and  stable weight of 100.6l lbs.   3. 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 She has been married greater than 56yrs to [place], who moved to [place] so that he could be closely involved in patient's care. [name] spends majority of the day with her in her memory care unit. Patient has 1 son and 1 daughter both of whom live in [place] and who visit regularly. Grandson age 76 is also a frequent visitor.  4. Follow up Palliative Care Visit: 04/08/2020. Monitor for further signs of decline. F/U [employer] form.  I spent 60 minutes providing this consultation from 8:30am to 9:30am. More than 50% of the time in this consultation was spent coordinating communication.   HISTORY OF PRESENT ILLNESS:  Ashlee Briggs is a 86 y.o.  female with h/o  late onset Alzheimer's, bronchiectasis, HTN, PVD, CHF.   Palliative Care was asked to help address goals of care.   CODE STATUS: DNR  PPS: 50%  HOSPICE ELIGIBILITY/DIAGNOSIS: TBD  PAST MEDICAL HISTORY:  Past Medical History:  Diagnosis Date  . Hyperlipidemia   . Hypertension   . Peripheral vascular disease ([facility])     SOCIAL HX:  Social History   Tobacco Use  . Smoking status: Former Smoker    Quit date: 08/14/1978    Years since quitting: 41.4  . Smokeless tobacco: Never Used  Substance Use Topics  . Alcohol use: No    ALLERGIES:  Allergies  Allergen Reactions  . Alendronate Other (See Comments)  . Risedronate Other (See Comments)  . Sulfa Antibiotics Other (See Comments)  PERTINENT MEDICATIONS:  Outpatient Encounter Medications as of 01/08/2020  Medication Sig  . acetaminophen (TYLENOL) 325 MG tablet Take 650 mg by mouth every 8 (eight) hours as needed.  . ARIPiprazole (ABILIFY) 2 MG tablet Take 2 mg by mouth at bedtime.  . busPIRone (BUSPAR) 7.5 MG tablet Take 7.5 mg by mouth 2 (two) times daily.  . carvedilol (COREG) 12.5 MG tablet Take 12.5 mg by mouth 2 (two) times daily with a meal.   .  Cholecalciferol (D3 HIGH POTENCY) 2000 units CAPS Take 2,000 Units by mouth daily.  . megestrol (MEGACE) 20 MG tablet Take 20 mg by mouth daily.  . melatonin 5 MG TABS Take 5 mg by mouth.  . memantine (NAMENDA) 10 MG tablet Take 10 mg by mouth 2 (two) times daily.  . Multiple Vitamin (MULTIVITAMIN) tablet Take 1 tablet by mouth daily.  . simvastatin (ZOCOR) 40 MG tablet Take 40 mg by mouth daily.  . Ascorbic Acid (VITAMIN C) 1000 MG tablet Take 1,000 mg by mouth daily.  Marland Kitchen Respiratory Therapy Supplies (FLUTTER) DEVI Use 10-15 times daily   No facility-administered encounter medications on file as of 01/08/2020.    Anselm Lis, NP [phone]

## 2020-01-30 ENCOUNTER — Emergency Department (HOSPITAL_COMMUNITY): Payer: Medicare Other

## 2020-01-30 ENCOUNTER — Other Ambulatory Visit: Payer: Self-pay

## 2020-01-30 ENCOUNTER — Observation Stay (HOSPITAL_COMMUNITY)
Admit: 2020-01-30 | Discharge: 2020-01-31 | Disposition: A | Discharge status: Skilled Nursing Facility | Payer: Medicare Other | Attending: Internal Medicine | Admitting: Internal Medicine

## 2020-01-30 DIAGNOSIS — F0390 Unspecified dementia without behavioral disturbance: Secondary | ICD-10-CM | POA: Insufficient documentation

## 2020-01-30 DIAGNOSIS — Z882 Allergy status to sulfonamides status: Secondary | ICD-10-CM | POA: Insufficient documentation

## 2020-01-30 DIAGNOSIS — Z8249 Family history of ischemic heart disease and other diseases of the circulatory system: Secondary | ICD-10-CM | POA: Diagnosis not present

## 2020-01-30 DIAGNOSIS — Z87891 Personal history of nicotine dependence: Secondary | ICD-10-CM | POA: Insufficient documentation

## 2020-01-30 DIAGNOSIS — I739 Peripheral vascular disease, unspecified: Secondary | ICD-10-CM | POA: Insufficient documentation

## 2020-01-30 DIAGNOSIS — I6782 Cerebral ischemia: Secondary | ICD-10-CM | POA: Insufficient documentation

## 2020-01-30 DIAGNOSIS — Z888 Allergy status to other drugs, medicaments and biological substances status: Secondary | ICD-10-CM | POA: Insufficient documentation

## 2020-01-30 DIAGNOSIS — R531 Weakness: Secondary | ICD-10-CM | POA: Insufficient documentation

## 2020-01-30 DIAGNOSIS — E785 Hyperlipidemia, unspecified: Secondary | ICD-10-CM | POA: Insufficient documentation

## 2020-01-30 DIAGNOSIS — R262 Difficulty in walking, not elsewhere classified: Secondary | ICD-10-CM | POA: Diagnosis not present

## 2020-01-30 DIAGNOSIS — R5383 Other fatigue: Secondary | ICD-10-CM | POA: Insufficient documentation

## 2020-01-30 DIAGNOSIS — I10 Essential (primary) hypertension: Secondary | ICD-10-CM | POA: Insufficient documentation

## 2020-01-30 DIAGNOSIS — Z79899 Other long term (current) drug therapy: Secondary | ICD-10-CM | POA: Insufficient documentation

## 2020-01-30 DIAGNOSIS — Z20822 Contact with and (suspected) exposure to covid-19: Secondary | ICD-10-CM | POA: Insufficient documentation

## 2020-01-30 DIAGNOSIS — G9340 Encephalopathy, unspecified: Principal | ICD-10-CM | POA: Insufficient documentation

## 2020-01-30 DIAGNOSIS — R4182 Altered mental status, unspecified: Secondary | ICD-10-CM | POA: Diagnosis present

## 2020-01-30 LAB — COMPREHENSIVE METABOLIC PANEL
ALT: 17 U/L (ref 0–44)
AST: 26 U/L (ref 15–41)
Albumin: 3.6 g/dL (ref 3.5–5.0)
Alkaline Phosphatase: 61 U/L (ref 38–126)
Anion gap: 12 (ref 5–15)
BUN: 10 mg/dL (ref 8–23)
CO2: 22 mmol/L (ref 22–32)
Calcium: 9.4 mg/dL (ref 8.9–10.3)
Chloride: 101 mmol/L (ref 98–111)
Creatinine, Ser: 0.65 mg/dL (ref 0.44–1.00)
GFR calc Af Amer: >60 mL/min (ref >60)
GFR calc non Af Amer: >60 mL/min (ref >60)
Glucose, Bld: 118 mg/dL — ABNORMAL HIGH (ref 70–99)
Potassium: 4.1 mmol/L (ref 3.5–5.1)
Sodium: 135 mmol/L (ref 135–145)
Total Bilirubin: 0.8 mg/dL (ref 0.3–1.2)
Total Protein: 7 g/dL (ref 6.5–8.1)

## 2020-01-30 LAB — CBC WITH DIFFERENTIAL/PLATELET
Abs Immature Granulocytes: 0.03 10*3/uL (ref 0.00–0.07)
Basophils Absolute: 0.1 10*3/uL (ref 0.0–0.1)
Basophils Relative: 1 %
Eosinophils Absolute: 0.1 10*3/uL (ref 0.0–0.5)
Eosinophils Relative: 2 %
HCT: 38 % (ref 36.0–46.0)
Hemoglobin: 12.1 g/dL (ref 12.0–15.0)
Immature Granulocytes: 0 %
Lymphocytes Relative: 15 %
Lymphs Abs: 1.2 10*3/uL (ref 0.7–4.0)
MCH: 30.1 pg (ref 26.0–34.0)
MCHC: 31.8 g/dL (ref 30.0–36.0)
MCV: 94.5 fL (ref 80.0–100.0)
Monocytes Absolute: 0.8 10*3/uL (ref 0.1–1.0)
Monocytes Relative: 10 %
Neutro Abs: 6.1 10*3/uL (ref 1.7–7.7)
Neutrophils Relative %: 72 %
Platelets: 289 10*3/uL (ref 150–400)
RBC: 4.02 MIL/uL (ref 3.87–5.11)
RDW: 12.4 % (ref 11.5–15.5)
WBC: 8.4 10*3/uL (ref 4.0–10.5)
nRBC: 0 % (ref 0.0–0.2)

## 2020-01-30 LAB — I-STAT VENOUS BLOOD GAS, ED
Acid-Base Excess: 0 mmol/L (ref 0.0–2.0)
Bicarbonate: 21.2 mmol/L (ref 20.0–28.0)
Calcium, Ion: 1.04 mmol/L — ABNORMAL LOW (ref 1.15–1.40)
HCT: 34 % — ABNORMAL LOW (ref 36.0–46.0)
Hemoglobin: 11.6 g/dL — ABNORMAL LOW (ref 12.0–15.0)
O2 Saturation: 97 %
Potassium: 3.7 mmol/L (ref 3.5–5.1)
Sodium: 137 mmol/L (ref 135–145)
TCO2: 22 mmol/L (ref 22–32)
pCO2, Ven: 25.1 mmHg — ABNORMAL LOW (ref 44.0–60.0)
pH, Ven: 7.534 — ABNORMAL HIGH (ref 7.250–7.430)
pO2, Ven: 80 mmHg — ABNORMAL HIGH (ref 32.0–45.0)

## 2020-01-30 LAB — URINALYSIS, ROUTINE W REFLEX MICROSCOPIC
Bilirubin Urine: NEGATIVE
Glucose, UA: NEGATIVE mg/dL
Hgb urine dipstick: NEGATIVE
Ketones, ur: 5 mg/dL — AB
Leukocytes,Ua: NEGATIVE
Nitrite: NEGATIVE
Protein, ur: NEGATIVE mg/dL
Specific Gravity, Urine: 1.014 (ref 1.005–1.030)
pH: 7 (ref 5.0–8.0)

## 2020-01-30 LAB — TSH: TSH: 2.654 u[IU]/mL (ref 0.350–4.500)

## 2020-01-30 LAB — SARS CORONAVIRUS 2 BY RT PCR (HOSPITAL ORDER, PERFORMED IN ~~LOC~~ HOSPITAL LAB): SARS Coronavirus 2: NEGATIVE

## 2020-01-30 LAB — SALICYLATE LEVEL: Salicylate Lvl: <7 mg/dL — ABNORMAL LOW (ref 7.0–30.0)

## 2020-01-30 LAB — ACETAMINOPHEN LEVEL: Acetaminophen (Tylenol), Serum: <10 ug/mL — ABNORMAL LOW (ref 10–30)

## 2020-01-30 MED ORDER — SODIUM CHLORIDE 0.9% FLUSH: 3.0000 mL | Freq: Two times a day (BID) | INTRAVENOUS | Status: DC

## 2020-01-30 MED ORDER — SODIUM CHLORIDE 0.9 % IV SOLN: INTRAVENOUS | Status: AC

## 2020-01-30 MED ORDER — ENOXAPARIN SODIUM 40 MG/0.4ML ~~LOC~~ SOLN
40.0000 mg | SUBCUTANEOUS | Status: DC
  Administered 2020-01-31: 40 mg via SUBCUTANEOUS
  Filled 2020-01-30: qty 0.4

## 2020-01-30 NOTE — ED Triage Notes (Signed)
Per EMS- pt here from [facility]. Pt has been weak for 1 week, being treated for a UTI. Since yesterday pt has been favoring left side, left side leaning and unable to walk. Unable to complete a stroke assessment, does not follow commands.

## 2020-01-30 NOTE — ED Notes (Signed)
Pt transported to CT ?

## 2020-01-30 NOTE — ED Notes (Signed)
Family updated at bedside .

## 2020-01-30 NOTE — ED Notes (Signed)
Gilmore Laroche (daughter) [phone] Christne Platts (son)  [phone]

## 2020-01-30 NOTE — ED Provider Notes (Signed)
[facility] EMERGENCY DEPARTMENT Provider Note   CSN: [id number] Arrival date & time: 01/30/20  [id number]     History Chief Complaint  Patient presents with  . Altered Mental Status    Ashlee Briggs is a 86 y.o. female.  86 year old female with past medical history below including hypertension, hyperlipidemia, PVD, dementia who presents with altered mental status.  Daughter reports that the patient has had approximately 1 week of progressively worsening weakness and lethargy at her nursing facility.  When she first started having symptoms, they suspected possible UTI and treated empirically with 3 days of Cipro on 6/16-6/18.  Daughter notes that initially said he seemed to perk up and at first they stopped the medication but then when she declined again they added it back.  She got a dose of Cipro last night.  Daughter states that she has not been talking with her today and she is normally conversant.  She is normally ambulatory with assistance but has not been able to walk due to her weakness.  She seems to be leaning towards the left since yesterday.  Daughter is not aware of any vomiting or diarrhea.  Reportedly had a low-grade fever this morning.  LEVEL 5 CAVEAT DUE TO DEMENTIA AND AMS  The history is provided by a relative and the nursing home.  Altered Mental Status      Past Medical History:  Diagnosis Date  . Hyperlipidemia   . Hypertension   . Peripheral vascular disease (HCC)     There are no problems to display for this patient.   No past surgical history on file.   OB History   No obstetric history on file.     Family History  Family history unknown: Yes    Social History   Tobacco Use  . Smoking status: Former Smoker    Quit date: 08/14/1978    Years since quitting: 41.4  . Smokeless tobacco: Never Used  Substance Use Topics  . Alcohol use: No  . Drug use: No    Home Medications Prior to Admission medications   Medication Sig Start  Date End Date Taking? Authorizing Provider  acetaminophen (TYLENOL) 325 MG tablet Take 650 mg by mouth every 8 (eight) hours as needed.    [provider]  ARIPiprazole (ABILIFY) 2 MG tablet Take 2 mg by mouth at bedtime.    [provider]  Ascorbic Acid (VITAMIN C) 1000 MG tablet Take 1,000 mg by mouth daily.    [provider]  busPIRone (BUSPAR) 7.5 MG tablet Take 7.5 mg by mouth 2 (two) times daily.    [provider]  carvedilol (COREG) 12.5 MG tablet Take 12.5 mg by mouth 2 (two) times daily with a meal.     [provider]  Cholecalciferol (D3 HIGH POTENCY) 2000 units CAPS Take 2,000 Units by mouth daily.    [provider]  megestrol (MEGACE) 20 MG tablet Take 20 mg by mouth daily.    [provider]  melatonin 5 MG TABS Take 5 mg by mouth.    [provider]  memantine (NAMENDA) 10 MG tablet Take 10 mg by mouth 2 (two) times daily.    [provider]  Multiple Vitamin (MULTIVITAMIN) tablet Take 1 tablet by mouth daily.    [provider]  Respiratory Therapy Supplies (FLUTTER) DEVI Use 10-15 times daily 01/17/17   Merwyn Katos, MD  simvastatin (ZOCOR) 40 MG tablet Take 40 mg by mouth daily.  [provider]    Allergies    Alendronate, Risedronate, and Sulfa antibiotics  Review of Systems   Review of Systems  Unable to perform ROS: Dementia    Physical Exam Updated Vital Signs BP (!) 144/58   Pulse 66   Temp 97.8 F (36.6 C)   Resp 17   SpO2 99%   Physical Exam Vitals and nursing note reviewed.  Constitutional:      General: She is not in acute distress.    Appearance: She is well-developed.     Comments: Thin, frail, sleeping  HENT:     Head: Normocephalic and atraumatic.     Nose: Nose normal.  Eyes:     Conjunctiva/sclera: Conjunctivae normal.     Pupils: Pupils are equal, round, and reactive to light.  Cardiovascular:     Rate and Rhythm: Normal rate and  regular rhythm.     Heart sounds: Normal heart sounds. No murmur heard.   Pulmonary:     Effort: Pulmonary effort is normal.     Breath sounds: Normal breath sounds.  Abdominal:     General: Bowel sounds are normal. There is no distension.     Palpations: Abdomen is soft.     Tenderness: There is no abdominal tenderness.  Musculoskeletal:        General: No tenderness.     Cervical back: Neck supple.     Right lower leg: No edema.     Left lower leg: No edema.  Skin:    General: Skin is warm and dry.  Neurological:     Comments: Asleep, arouses with sternal rub and states "leave me alone" while keeping eyes closed; does demonstrate normal strength BUE, could not get pt to cooperate for strength testing LE     ED Results / Procedures / Treatments   Labs (all labs ordered are listed, but only abnormal results are displayed) Labs Reviewed  COMPREHENSIVE METABOLIC PANEL - Abnormal; Notable for the following components:      Result Value   Glucose, Bld 118 (*)    All other components within normal limits  ACETAMINOPHEN LEVEL - Abnormal; Notable for the following components:   Acetaminophen (Tylenol), Serum <10 (*)    All other components within normal limits  SALICYLATE LEVEL - Abnormal; Notable for the following components:   Salicylate Lvl <7.0 (*)    All other components within normal limits  URINALYSIS, ROUTINE W REFLEX MICROSCOPIC - Abnormal; Notable for the following components:   Ketones, ur 5 (*)    All other components within normal limits  SARS CORONAVIRUS 2 BY RT PCR (HOSPITAL ORDER, PERFORMED IN [facility])  URINE CULTURE  TSH  CBC WITH DIFFERENTIAL/PLATELET  CBC WITH DIFFERENTIAL/PLATELET  RAPID URINE DRUG SCREEN, HOSP PERFORMED    EKG None  Radiology DG Chest 2 View  Result Date: 01/30/2020 CLINICAL DATA:  Altered mental status EXAM: CHEST - 2 VIEW COMPARISON:  12/15/2016, CT chest, 01/13/2017 FINDINGS: The heart size and mediastinal  contours are within normal limits. Branching opacity of the right lower lobe is generally similar in appearance to prior radiographs and CT, which demonstrated extensive bronchoceles and nodular airspace opacity. The visualized skeletal structures are unremarkable. IMPRESSION: 1. Branching opacity of the right lower lobe is generally similar in appearance to prior radiographs and CT, which demonstrated extensive bronchoceles and nodular airspace opacity most consistent with allergic bronchopulmonary aspergillosis. 2.  There is no acutely superimposed airspace opacity. Electronically Signed   By: Trinna Post  Laqueta Carina M.D.   On: 01/30/2020 11:05   CT Head Wo Contrast  Result Date: 01/30/2020 CLINICAL DATA:  Altered mental status EXAM: CT HEAD WITHOUT CONTRAST TECHNIQUE: Contiguous axial images were obtained from the base of the skull through the vertex without intravenous contrast. COMPARISON:  2018 FINDINGS: Brain: There is no acute intracranial hemorrhage, mass effect, or edema. Gray-white differentiation is preserved. There is no extra-axial fluid collection. Prominence of the ventricles and sulci reflects generalized parenchymal volume loss, which has increased. Relative prominence of ventricles is likely on an ex vacuo basis. Confluent hypoattenuation in the supratentorial white matter is nonspecific but probably reflects advanced chronic microvascular ischemic changes. Vascular: There is atherosclerotic calcification at the skull base. Skull: Calvarium is unremarkable. Sinuses/Orbits: No acute finding. Other: None. IMPRESSION: No acute intracranial abnormality. Advanced chronic microvascular ischemic changes. Electronically Signed   By: Macy Mis M.D.   On: 01/30/2020 13:21    Procedures Procedures (including critical care time)  Medications Ordered in ED Medications - No data to display  ED Course  I have reviewed the triage vital signs and the nursing notes.  Pertinent labs & imaging results that  were available during my care of the patient were reviewed by me and considered in my medical decision making (see chart for details).    MDM Rules/Calculators/A&P                          Vs reassuring, no focal areas of tenderness. DDx is broad and includes infection, electrolyte disturbance such as hyponatremia, dehydration, intracranial process.  Lab work shows normal UA, reassuring CMP and CBC, negative COVID-19, normal ammonia level, normal TSH, negative Tylenol and salicylate levels.  Chest x-ray shows chronic changes similar to previous.  Head CT negative acute.  After several hours in the ED, the patient remains sleepy/somnolent and her mental status has not improved.  Given the significant change from her baseline, discussed admission for further work-up with internal medicine teaching service.  Daughter in agreement with plan. Final Clinical Impression(s) / ED Diagnoses Final diagnoses:  Altered mental status, unspecified altered mental status type    Rx / DC Orders ED Discharge Orders    None       Alonso Gapinski, Wenda Overland, MD 01/30/20 [phone]

## 2020-01-30 NOTE — H&P (Signed)
Date: 01/30/2020               Patient Name:  Ashlee Briggs MRN: [id number]  DOB: [birth year] Age / Sex: 86 y.o., female   PCP: Adin Hector, MD         Medical Service: Internal Medicine Teaching Service         Attending Physician: Dr. Aldine Contes, MD    First Contact: Dr. Gilford Rile Pager: [phone]  Second Contact: Dr. Sharon Seller Pager: [phone]       After Hours (After 5p/  First Contact Pager: [phone]  weekends / holidays): Second Contact Pager: [phone]   Chief Complaint: Altered Mental Status  History of Present Illness:  [name] is a 86 y/o female, with a PMH of dementia, HLD, HTN, and PVD, who presents to [facility] with altered mental status. History was obtained, with some assistance, by the patient and her daughter, as well as chart review. Patient has been a resident at [facility] for 6 weeks. Patient does have a history of dementia and is typically oriented to self, and have conversations that are typically tangential. Over the past week, patient has been getting increasingly lethargic and weaker in her presentation. Patient went from being able with assistance of her walker and help from staff, to being to weak to ambulate. Initially, Ms. Coughlin was treated empirically with a three day dose of cipro with initial improvement in her symptoms.But yesterday, she was noted to be leaning towards the left. Per her daughter, the patient has not been acting in her usual state of mind, but has been improving in mentation over the past hour. Patient denies nausea, vomiting, chest pain, abdominal pain. Daughter states that patient is incontinent at baseline.   Meds:  Current Meds  Medication Sig  . acetaminophen (TYLENOL) 325 MG tablet Take 650 mg by mouth every 8 (eight) hours as needed for moderate pain.   . ARIPiprazole (ABILIFY) 2 MG tablet Take 2 mg by mouth at bedtime.  . busPIRone (BUSPAR) 7.5 MG tablet Take 7.5 mg by mouth 2 (two) times daily as needed (anxiety).     . carvedilol (COREG) 12.5 MG tablet Take 12.5 mg by mouth 2 (two) times daily with a meal.   . Cholecalciferol (VITAMIN D) 50 MCG (2000 UT) tablet Take 2,000 Units by mouth every evening.  . megestrol (MEGACE) 20 MG tablet Take 20 mg by mouth daily.  . melatonin 5 MG TABS Take 10 mg by mouth at bedtime.   . memantine (NAMENDA) 10 MG tablet Take 10 mg by mouth 2 (two) times daily.  . Multiple Vitamin (MULTIVITAMIN) tablet Take 1 tablet by mouth daily.  . simvastatin (ZOCOR) 40 MG tablet Take 40 mg by mouth every evening.     Allergies: Allergies as of 01/30/2020 - Review Complete 01/30/2020  Allergen Reaction Noted  . Alendronate Other (See Comments) 05/23/2014  . Risedronate Other (See Comments) 05/23/2014  . Sulfa antibiotics Other (See Comments) 05/23/2014   Past Medical History:  Diagnosis Date  . Hyperlipidemia   . Hypertension   . Peripheral vascular disease ([place])     Family History:  HTN: In patient's mother and father DM: None Cancer: Patient with colon cancer  Social History:  Lives at memory unit, husband in assisted living. Per daughter denies ETOH, Tobacco, or drug use.   Review of Systems: A complete ROS was negative except as per HPI.   Physical Exam: Blood pressure (!) 166/62, pulse 82, temperature 97.8  F (36.6 C), resp. rate 20, SpO2 94 %. Physical Exam Constitutional:      General: She is not in acute distress.    Appearance: Normal appearance. She is not ill-appearing.     Comments: Alert to self, able to intermittently follow commands. No acute distress, resting comfortably in bed.   Cardiovascular:     Rate and Rhythm: Normal rate and regular rhythm.     Pulses: Normal pulses.     Heart sounds: Normal heart sounds. No murmur heard.  No friction rub. No gallop.   Pulmonary:     Effort: Pulmonary effort is normal.     Breath sounds: Normal breath sounds. No wheezing, rhonchi or rales.  Abdominal:     General: Abdomen is flat. Bowel sounds are  normal.     Palpations: There is no mass.     Tenderness: There is no abdominal tenderness. There is no guarding or rebound.  Musculoskeletal:     Comments: Unable to follow commands for bilateral lower extremities, but able to lift both lower extremities off the bed.  Strength 5/5 in the upper extremities bilaterally.   Neurological:     Mental Status: She is alert.  Unable to follow most CN examination, CN VII intact bilaterally, CN XI Intact, PERRL, CN V intact bilaterally.    EKG: personally reviewed my interpretation is sinus rhythm with no ischemic changes.   CXR: personally reviewed my interpretation is opacity of the RLL is generally similar to previous to prior radiographs and CT.   Assessment & Plan by Problem: Active Problems:   Acute encephalopathy  [name] is  86 y/o female, HTN, HLD, PVD, who was seen in the ED for AMS and admitted for further workup.   Altered Mental Status:  Ms. Spizzirri is a pleasant patient with dementia, htn, pvd. Her workup so far has not showed metabolic derangements/electrolyte abnormalities. Her TSH is negative. Her liver enzymes are WNL. She is able to protect her airway, handling secretions well. She was recently treated with cipro a week ago for possible UTI, there was no urinalysis and culture at the facility, but she was sluggish and her urine was malodorous. She has no leukocytosis, fevers, tachycardia or other signs pointing towards an infectious etiology. She has had no trauma or falls during her lodging at her facility. Structurally, her CT scan shows chronic changes, but no acute etiology. For the parts of the neuro examination she could participate in, she had full strength and perception and cranial nerves were in tact, making stroke less likely. Her salicylate and acetaminophen levels were negative, but she was receiving Abilify at the facility, which was supposed to be halted because she appeared drowsy. While this may be a contributing  factor to her presentation, she would have presented sooner than 6 weeks later. Will undergo a rapid UDS to r/o other toxic causes. She has been having difficulty with sleep at her facility as well and the daughter does endorse "reverse cycling." Overall, patient presenting with labs and imaging within normal limits, will continue further workup and admit to inpatient for observation.  - VBG - UDS  -  Delirium Precautions  - Cardiac monitoring - Soft diet - OT and PT eval and treat  - Vital signs  Hypertension:  - Holding medications   Dispo: Admit patient to Inpatient with expected length of stay greater than 2 midnights.  Signed: Dolan Amen, MD 01/30/2020, 3:53 PM  Pager: [phone] After 5pm on weekdays and 1pm on  weekends: On Call pager: [phone]

## 2020-01-30 NOTE — ED Notes (Signed)
Patient is undress in a gown on monitor.patient has a purwick on clean chux under pt.

## 2020-01-31 DIAGNOSIS — R4182 Altered mental status, unspecified: Secondary | ICD-10-CM | POA: Diagnosis not present

## 2020-01-31 DIAGNOSIS — G9340 Encephalopathy, unspecified: Secondary | ICD-10-CM | POA: Diagnosis not present

## 2020-01-31 LAB — CBC
HCT: 34 % — ABNORMAL LOW (ref 36.0–46.0)
Hemoglobin: 10.7 g/dL — ABNORMAL LOW (ref 12.0–15.0)
MCH: 29.7 pg (ref 26.0–34.0)
MCHC: 31.5 g/dL (ref 30.0–36.0)
MCV: 94.4 fL (ref 80.0–100.0)
Platelets: 233 10*3/uL (ref 150–400)
RBC: 3.6 MIL/uL — ABNORMAL LOW (ref 3.87–5.11)
RDW: 12.3 % (ref 11.5–15.5)
WBC: 7.8 10*3/uL (ref 4.0–10.5)
nRBC: 0 % (ref 0.0–0.2)

## 2020-01-31 LAB — COMPREHENSIVE METABOLIC PANEL
ALT: 14 U/L (ref 0–44)
AST: 23 U/L (ref 15–41)
Albumin: 2.9 g/dL — ABNORMAL LOW (ref 3.5–5.0)
Alkaline Phosphatase: 54 U/L (ref 38–126)
Anion gap: 9 (ref 5–15)
BUN: 9 mg/dL (ref 8–23)
CO2: 20 mmol/L — ABNORMAL LOW (ref 22–32)
Calcium: 8.1 mg/dL — ABNORMAL LOW (ref 8.9–10.3)
Chloride: 107 mmol/L (ref 98–111)
Creatinine, Ser: 0.66 mg/dL (ref 0.44–1.00)
GFR calc Af Amer: >60 mL/min (ref >60)
GFR calc non Af Amer: >60 mL/min (ref >60)
Glucose, Bld: 92 mg/dL (ref 70–99)
Potassium: 3.7 mmol/L (ref 3.5–5.1)
Sodium: 136 mmol/L (ref 135–145)
Total Bilirubin: 0.9 mg/dL (ref 0.3–1.2)
Total Protein: 5.8 g/dL — ABNORMAL LOW (ref 6.5–8.1)

## 2020-01-31 MED ORDER — SODIUM CHLORIDE 0.9 % IV BOLUS
500.0000 mL | Freq: Once | INTRAVENOUS | Status: AC
  Administered 2020-01-31: 500 mL via INTRAVENOUS

## 2020-01-31 NOTE — ED Notes (Signed)
Provided pt with sandwich bag, drinks and snack.

## 2020-01-31 NOTE — Discharge Instructions (Signed)
To Ms. Scarpelli,  It was a pleasure working with you during your stay at [facility]. You were admitted for altered mental status. We watched you overnight, and ordered head imaging and labs, which came back negative. Please hold off on your Abilify as well as ciprofloxacin. Please go to the nearest emergency department if you experience a sudden change in your mental status, slurred speech, or sudden weakness.     Delirium Delirium is a state of mental confusion. It comes on quickly and causes significant changes in a person's thinking and behavior. People with delirium usually have trouble paying attention to what is going on or knowing where they are. They may become very withdrawn or very emotional and unable to sit still. They may even see or feel things that are not there (hallucinations). Delirium is a sign of a serious underlying medical condition. What are the causes? Delirium occurs when something suddenly affects the signals that the brain sends out. Brain signals can be affected by anything that puts severe stress on the body and brain and causes brain chemicals to be out of balance. The most common causes of delirium include:  Infections. These may be bacterial, viral, fungal, or protozoal.  Medicines. These include many over-the-counter and prescription medicines.  Recreational drugs.  Substance withdrawal. This occurs with sudden discontinuation of alcohol, certain medicines, or recreational drugs.  Surgery and anesthesia.  Sudden vascular events, such as stroke and brain hemorrhage.  Other brain disorders, such as migraines, tumors, seizures, and physical head trauma.  Metabolic disorders, such as kidney or liver failure.  Low blood oxygen (anoxia). This may occur with lung disease, cardiac arrest, or carbon monoxide poisoning.  Hormone imbalances (endocrinopathies), such as an overactive thyroid (hyperthyroidism) or underactive thyroid (hypothyroidism).  Vitamin  deficiencies. What increases the risk? The following factors may make someone more likely to develop this condition.  Being a child.  Being an older person.  Living alone.  Having vision loss or hearing loss.  Having an existing brain disease, such as dementia.  Having long-lasting (chronic) medical conditions, such as heart disease.  Being hospitalized for long periods of time. What are the signs or symptoms? Delirium starts with a sudden change in a person's thinking or behavior. Symptoms include:  Not being able to stay awake (drowsiness) or pay attention.  Being confused about places, time, and people.  Forgetfulness.  Having extreme energy levels. These may be low or high.  Changes in sleep patterns.  Extreme mood swings, such as sudden anger or anxiety.  Focusing on things or ideas that are not important.  Rambling and senseless talking.  Difficulty speaking, understanding speech, or both.  Hallucinations.  Tremor or unsteady gait. Symptoms come and go (fluctuate) over time, and they are often worse at the end of the day. How is this diagnosed? People with delirium may not realize that they have the condition. Often, a family member or health care provider is the first person to notice the changes. This condition may be diagnosed based on a physical exam, health history, and tests.  The health care provider will obtain a detailed history. This may include questions about: ? Current symptoms. ? Medical issues. ? Medicines. ? Recreational drug use.  The health care provider will perform a mental status examination by: ? Asking questions to check for confusion. ? Watching for abnormal behavior.  The health care provider may also order lab tests or additional studies to determine the cause of the delirium. How is  this treated? Treatment of delirium depends on the cause and severity. Delirium usually goes away within days or weeks of treating the underlying  cause. In the meantime, do not leave the person alone because he or she may accidentally cause self-harm. This condition may be treated with supportive care, such as:  Increased light during the day and decreased light at night.  Low noise level.  Uninterrupted sleep.  A regular daily schedule.  Clocks and calendars to help with orientation.  Familiar objects, including the person's pictures and clothing.  Frequent visits from familiar family and friends.  A healthy diet.  Gentle exercise. In more severe cases of delirium, medicine may be prescribed to help the person keep calm and think more clearly. Follow these instructions at home:  Continue supportive care as told by a health care provider.  Over-the-counter and prescription medicines should be taken only as told by a health care provider.  Ask a health care provider before using herbs or supplements.  Do not use alcohol or recreational drugs.  Keep all follow-up visits as told by a health care provider. This is important. Contact a health care provider if:  Symptoms do not get better or they become worse.  New symptoms of delirium develop.  Caring for the person at home does not seem safe.  Eating, drinking, or communicating stops.  There are side effects of medicines, such as changes in sleep patterns, dizziness, weight gain, restlessness, movement changes, or tremors. Get help right away if:  Serious thoughts occur about self-harm or about hurting others.  There are serious side effects of medicine, such as: ? Swelling of the face, lips, tongue, or throat. ? Fever, confusion, muscle spasms, or seizures. Summary  Delirium is a state of mental confusion. It comes on quickly and causes significant changes in a person's thinking and behavior.  Delirium is a sign of a serious underlying medical condition.  Certain medical conditions or a long hospital stay may increase the risk of developing  delirium.  Treatment of delirium involves treating the underlying cause and providing supportive treatments, such as a calm and familiar environment. This information is not intended to replace advice given to you by your health care provider. Make sure you discuss any questions you have with your health care provider. Document Revised: 03/16/2018 Document Reviewed: 03/16/2018 Elsevier Patient Education  [place].

## 2020-01-31 NOTE — Care Management CC44 (Signed)
Condition Code 44 Documentation Completed  Patient Details  Name: Ashlee Briggs MRN: [id number] Date of Birth: November 20, 1927   Condition Code 44 given:  (P) Yes Patient signature on Condition Code 44 notice:  (P)  (Patient unable to sign letter due to medical condition. Letter explained in detail to spouse; all questions answered with understanding) Documentation of 2 MD's agreement:  (P) Yes Code 44 added to claim:  (P) Yes    Cherrie Distance, RN 01/31/2020, 10:15 AM

## 2020-01-31 NOTE — ED Notes (Signed)
Lunch Tray Ordered @ 1052.  

## 2020-01-31 NOTE — Care Management Obs Status (Signed)
MEDICARE OBSERVATION STATUS NOTIFICATION   Patient Details  Name: Ashlee Briggs MRN: [id number] Date of Birth: [birth year]   Medicare Observation Status Notification Given:  Other (see comment) (patient unable to sign due to medical condition. Letter explained in detail to her spouse Ashlee Briggs; all questions answered)    Cherrie Distance, RN 01/31/2020, 9:58 AM

## 2020-01-31 NOTE — ED Notes (Signed)
Full bed change with brief completed. Rounding MD team at bedside.

## 2020-01-31 NOTE — Care Management (Signed)
    Durable Medical Equipment  (From admission, onward)         Start     Ordered   01/31/20 1301  For home use only DME lightweight manual wheelchair with seat cushion  Once       Comments: Patient suffers from Alzheimer's Dementia which impairs their ability to perform daily activities like dressing and grooming in the home.  A cane, crutch, or walker will not resolve  issue with performing activities of daily living. A wheelchair will allow patient to safely perform daily activities. Patient is not able to propel themselves in the home using a standard weight wheelchair due to arm weakness, endurance, and general weakness. Patient can self propel in the lightweight wheelchair. Length of need Lifetime. Accessories: elevating leg rests (ELRs), wheel locks, extensions and anti-tippers.   01/31/20 1302

## 2020-01-31 NOTE — ED Notes (Signed)
Pt. Repositioned in bed. Yellow socks and yellow bracelet placed on pt. Pt near nurses station.

## 2020-01-31 NOTE — Discharge Summary (Signed)
Name: Ashlee Briggs MRN: [id number] DOB: October 05, 1927 86 y.o. PCP: Lynnea Ferrier, MD  Date of Admission: 01/30/2020  9:07 AM Date of Discharge: 01/31/2020 Attending Physician: Earl Lagos, MD  Discharge Diagnosis: 1. Acute Encephalopathy 2. Hypertension 3. Hyperlipidemia  Discharge Medications: Allergies as of 01/31/2020      Reactions   Alendronate Other (See Comments)   Risedronate Other (See Comments)   Sulfa Antibiotics Other (See Comments)      Medication List    STOP taking these medications   ARIPiprazole 2 MG tablet Commonly known as: ABILIFY     TAKE these medications   acetaminophen 325 MG tablet Commonly known as: TYLENOL Take 650 mg by mouth every 8 (eight) hours as needed for moderate pain.   busPIRone 7.5 MG tablet Commonly known as: BUSPAR Take 7.5 mg by mouth 2 (two) times daily as needed (anxiety).   carvedilol 12.5 MG tablet Commonly known as: COREG Take 12.5 mg by mouth 2 (two) times daily with a meal.   Flutter Devi Use 10-15 times daily   megestrol 20 MG tablet Commonly known as: MEGACE Take 20 mg by mouth daily.   melatonin 5 MG Tabs Take 10 mg by mouth at bedtime.   memantine 10 MG tablet Commonly known as: NAMENDA Take 10 mg by mouth 2 (two) times daily.   multivitamin tablet Take 1 tablet by mouth daily.   simvastatin 40 MG tablet Commonly known as: ZOCOR Take 40 mg by mouth every evening.   Vitamin D 50 MCG (2000 UT) tablet Take 2,000 Units by mouth every evening.       Disposition and follow-up:   Ashlee Briggs was discharged from [facility] in Stable condition.  At the hospital follow up visit please address:  1. Altered Mental Status:   - Hold Abilify   - Avoid flouroquinolones for UTI due to side effect profile.   2. Hypertenison:   - Continue Coreg 12.5 mg two times daily, monitor blood pressure 3. Hyperlipidemia:   - Continue Zocor 40 mg  2.  Labs / imaging needed at time of  follow-up: None  3.  Pending labs/ test needing follow-up: None  Follow-up Appointments:   Hospital Course by problem list: 1. Acute Encephalopathy:  Patient arrived from [facility] unit with altered mental status. During her emergency department course, her labs came back within normal limits. Her CT head showed advanced chronic microvascular ischemic changes, with no signs of bleeding or trauma. When evaluated by the admitting team, patient's daughter was at bedside, and stated that her mother was returning to bedside. Patient was alert to self, and able to follow commands intermittently, which is baseline per her daughter. Patient was observed overnight and discharged in stable condition.   2. Hypertension:  Patient was seen in the emergency department for altered mental status. Her medications were held during admission. Patient was observed overnight, and discharged in stable condition back on her home medications.   3. Hyperlipidemia:  Patient was observed in the emergency department for acute encephalopathy. She was discharged in stable condition back on her home medications.   Discharge Vitals:   BP (!) 121/54 (BP Location: Left Arm)   Pulse 67   Temp 98 F (36.7 C) (Axillary)   Resp 16   SpO2 99%   Pertinent Labs, Studies, and Procedures:  CBC Latest Ref Rng & Units 01/31/2020 01/30/2020 01/30/2020  WBC 4.0 - 10.5 K/uL 7.8 - 8.4  Hemoglobin 12.0 - 15.0 g/dL  10.7(L) 11.6(L) 12.1  Hematocrit 36 - 46 % 34.0(L) 34.0(L) 38.0  Platelets 150 - 400 K/uL 233 - 289   BMP Latest Ref Rng & Units 01/31/2020 01/30/2020 01/30/2020  Glucose 70 - 99 mg/dL 92 - 118(H)  BUN 8 - 23 mg/dL 9 - 10  Creatinine 0.44 - 1.00 mg/dL 0.66 - 0.65  Sodium 135 - 145 mmol/L 136 137 135  Potassium 3.5 - 5.1 mmol/L 3.7 3.7 4.1  Chloride 98 - 111 mmol/L 107 - 101  CO2 22 - 32 mmol/L 20(L) - 22  Calcium 8.9 - 10.3 mg/dL 8.1(L) - 9.4   Urinalysis    Component Value Date/Time   COLORURINE YELLOW  01/30/2020 1105   APPEARANCEUR CLEAR 01/30/2020 1105   LABSPEC 1.014 01/30/2020 1105   PHURINE 7.0 01/30/2020 1105   GLUCOSEU NEGATIVE 01/30/2020 1105   HGBUR NEGATIVE 01/30/2020 1105   BILIRUBINUR NEGATIVE 01/30/2020 1105   KETONESUR 5 (A) 01/30/2020 1105   PROTEINUR NEGATIVE 01/30/2020 1105   NITRITE NEGATIVE 01/30/2020 1105   LEUKOCYTESUR NEGATIVE 01/30/2020 1105   EXAM: CT HEAD WITHOUT CONTRAST  TECHNIQUE: Contiguous axial images were obtained from the base of the skull through the vertex without intravenous contrast.  COMPARISON:  2018  FINDINGS: Brain: There is no acute intracranial hemorrhage, mass effect, or edema. Gray-white differentiation is preserved. There is no extra-axial fluid collection. Prominence of the ventricles and sulci reflects generalized parenchymal volume loss, which has increased. Relative prominence of ventricles is likely on an ex vacuo basis. Confluent hypoattenuation in the supratentorial white matter is nonspecific but probably reflects advanced chronic microvascular ischemic changes.  Vascular: There is atherosclerotic calcification at the skull base.  Skull: Calvarium is unremarkable.  Sinuses/Orbits: No acute finding.  Other: None.  IMPRESSION: No acute intracranial abnormality.  Advanced chronic microvascular ischemic changes.   CHEST - 2 VIEW  COMPARISON:  12/15/2016, CT chest, 01/13/2017  FINDINGS: The heart size and mediastinal contours are within normal limits. Branching opacity of the right lower lobe is generally similar in appearance to prior radiographs and CT, which demonstrated extensive bronchoceles and nodular airspace opacity. The visualized skeletal structures are unremarkable.  IMPRESSION: 1. Branching opacity of the right lower lobe is generally similar in appearance to prior radiographs and CT, which demonstrated extensive bronchoceles and nodular airspace opacity most consistent  with allergic bronchopulmonary aspergillosis.  2.  There is no acutely superimposed airspace opacity.  Discharge Instructions: Discharge Instructions    Call MD for:  extreme fatigue   Complete by: As directed    Call MD for:  persistant dizziness or light-headedness   Complete by: As directed    Diet - low sodium heart healthy   Complete by: As directed    Increase activity slowly   Complete by: As directed       Signed: Maudie Mercury, MD 01/31/2020, 10:37 AM   Pager: [phone]

## 2020-01-31 NOTE — ED Notes (Signed)
Called service response to follow-up on missing breakfast tray. A new tray will be sent down. Advised tray will take 45-60 mins to arrive.

## 2020-01-31 NOTE — Progress Notes (Signed)
  Date: 01/31/2020  Patient name: Ashlee Briggs  Medical record number: [id number]  Date of birth: 10-27-1927   I have seen and evaluated Carlota Raspberry and discussed their care with the Residency Team.  In brief, patient is a 86 year old female with a past medical history of dementia, hyperlipidemia, hypertension and peripheral vascular disease who presented to the ED with altered mental status over the last week.  History obtained from chart as patient is unable to provide a history at this time.  Patient has been a resident of [facility] for the last 6 weeks.  Patient is oriented to self and is able to have conversations but these are usually tangential.  Over the past week, patient was noted to have worsening lethargy and weakness and was unable to ambulate with her walker which he usually is able to do with help.  Patient was treated empirically for UTI with ciprofloxacin at the SNF with initial improvement of her symptoms but was noted to be leaning towards the left yesterday by her daughter and was brought to the ED for further evaluation.  Patient denies any pain currently.  Daughter is at bedside and states that patient does appear better today than yesterday.  PMHx, Fam Hx, and/or Soc Hx : As per resident admit note  Vitals:   01/31/20 0558 01/31/20 0747  BP: (!) 129/53 (!) 121/54  Pulse: 67 67  Resp: 14 16  Temp:  98 F (36.7 C)  SpO2: 99% 99%   General: Awake, alert, NAD CVs: Regular rhythm, normal heart sounds Lungs: CTA laterally Abdomen: Soft, nontender, nondistended, normoactive bowel sounds Extremities: No edema noted, nontender to palpation Psych: Pleasant mood, not oriented HEENT: Normocephalic, atraumatic Skin: Warm and dry  Assessment and Plan: I have seen and evaluated the patient as outlined above. I agree with the formulated Assessment and Plan as detailed in the residents' note, with the following changes:   1.  Altered mental status: -Patient presented to the ED  with altered mental status over the last week and was treated empirically for UTI at her nursing facility with ciprofloxacin.  The etiology behind her altered mental status remains uncertain at this time but she appears to be doing better today per her daughter at bedside. -Work-up thus far has been unrevealing including CT head, UA, TSH and other blood work. -PT/OT follow-up and recommendations are pending -Patient should be stable for DC back to SNF today.  Earl Lagos, MD 6/24/20211:18 PM

## 2020-01-31 NOTE — ED Notes (Signed)
MD paged RN back, orders will be placed shortly and they will round on pt

## 2020-01-31 NOTE — Progress Notes (Signed)
Paged by RN that patient's blood pressure had acutely dropped to 88/45 with MAP of 56 and similar on repeat. She was resting comfortably in bed without any other significant clinical change.  Went to evaluate patient at bedside. She was found to be in the trendelenburg position sleeping comfortably. Other vitals including heart rate were normal. She had just been started on a small 500 cc bolus. Blood pressure on our evaluation was 133/48 which is more consistent with her BP trends since admission. Extremities are warm and well perfused on exam.   Plan  -ok to continue small fluid bolus followed by gentle fluid rate ordered on admission -RN will continue to monitor patient and will page MD if MAP<60  Lenward Chancellor, DO PGY-2  IMTS 01/31/20, 2:11 am

## 2020-01-31 NOTE — ED Notes (Signed)
Report given to RN Liz.

## 2020-01-31 NOTE — Care Management (Signed)
ED CM spoke with PT and recommendation is a w/c for mobility at the facility. CM spoke with family who is agreeable to this recommendation. CM offered choice no preference noted. Referral forwarded to  [facility] and family has agreed to transport the w/c to the facility.

## 2020-01-31 NOTE — Progress Notes (Signed)
   Subjective:  O/N Events: Low pressures (88/45) with a MAP of 56, when seen patient was asleep, and BP was 133/48. Small 500 cc bolus initiated.   Ms. Ashlee Briggs was evaluated at bedside this morning. She was alert to self, and answering questions intermittently. Her daughter is at bedside, and states that patient does appear better than yesterday. Ms. Ashlee Briggs has no complaints this morning. We discussed having her return to [facility] today as her labs were unrevealing. Patient and daughter agree to the plan. All questions and concerns were addressed.   Objective:  Vital signs in last 24 hours: Vitals:   01/31/20 0301 01/31/20 0400 01/31/20 0500 01/31/20 0558  BP: (!) 95/53 (!) 118/50 (!) 111/52 (!) 129/53  Pulse: 66 66 65 67  Resp: 15 13 14 14   Temp:      SpO2: 99% 98% 99% 99%   Physical Exam Constitutional:      General: She is not in acute distress.    Appearance: Normal appearance. She is not ill-appearing, toxic-appearing or diaphoretic.     Comments: Resting comfortably in bed. Oriented to self. Appears in no acute distress.   Eyes:     Extraocular Movements: Extraocular movements intact.  Cardiovascular:     Rate and Rhythm: Normal rate and regular rhythm.     Pulses: Normal pulses.     Heart sounds: Normal heart sounds. No murmur heard.  No friction rub. No gallop.   Pulmonary:     Effort: Pulmonary effort is normal.     Breath sounds: Normal breath sounds. No wheezing, rhonchi or rales.  Abdominal:     General: Abdomen is flat. Bowel sounds are normal.     Tenderness: There is no abdominal tenderness.  Musculoskeletal:     Right lower leg: No edema.     Left lower leg: No edema.     Comments: Unable to follow orders for strength bilaterally in the upper and lower extremity.   Skin:    General: Skin is warm and dry.  Neurological:     Mental Status: She is alert.     LABS:  CBC Latest Ref Rng & Units 01/31/2020 01/30/2020 01/30/2020  WBC 4.0 - 10.5 K/uL 7.8 - 8.4    Hemoglobin 12.0 - 15.0 g/dL 10.7(L) 11.6(L) 12.1  Hematocrit 36 - 46 % 34.0(L) 34.0(L) 38.0  Platelets 150 - 400 K/uL 233 - 289    Assessment/Plan:  Active Problems:   Acute encephalopathy  Altered Mental Status:  Patient's lab results were unrevealing, vbg shows pH of 7.53. Bicarb on CMP is 20, but otherwise unrevealing labs this morning.  - UDS: Pending  -  Delirium Precautions  - Cardiac monitoring - Soft diet - OT and PT eval and treat  - Vital signs  Hypertension:  - Holding medications  Prior to Admission Living Arrangement: [facility] Anticipated Discharge Location: [place] Barriers to Discharge: Bed Placement Dispo: Anticipated discharge today pending bed placement.   02/01/2020, MD 01/31/2020, 7:01 AM Pager: [phone] After 5pm on weekdays and 1pm on weekends: On Call pager [phone]

## 2020-01-31 NOTE — Evaluation (Signed)
Physical Therapy Evaluation Patient Details Name: Ashlee Briggs MRN: [id number] DOB: 1928-04-10 Today's Date: 01/31/2020   History of Present Illness  Pt is a 86 y/o female admitted secondary to AMS and difficulty ambulating. PMH includes HTN and Dementia.   Clinical Impression  Pt admitted secondary to problem above with deficits below. Pt requiring mod A to stand X4 this session. Pt with posterior lean, so unable to take steps. Per family, pt from [facility]. Feel it would be beneficial for pt to return to familiar environment at d/c if ALF able to provide necessary assist. Feel she would benefit from use of WC given current deficits; would need assist with transfers to [facility]. If ALF unable to provide necessary assist, recommend SNF at d/c. Will continue to follow acutely to maximize functional mobility independence and safety.     Follow Up Recommendations Home health PT (SNF if ALF unable to provide necessary assist. )    Equipment Recommendations  Wheelchair cushion (measurements PT);Wheelchair (measurements PT)    Recommendations for Other Services       Precautions / Restrictions Precautions Precautions: Fall Restrictions Weight Bearing Restrictions: No      Mobility  Bed Mobility Overal bed mobility: Needs Assistance Bed Mobility: Supine to Sit;Sit to Supine     Supine to sit: Min assist Sit to supine: Max assist   General bed mobility comments: Min A for trunk elevation. Difficulty sequencing and requiring min A for bed mobility.   Transfers Overall transfer level: Needs assistance Equipment used: Rolling walker (2 wheeled);None Transfers: Sit to/from Stand Sit to Stand: Mod assist         General transfer comment: Mod A for lift assist and steadying to stand. Pt with posterior lean. Attempted X3 with RW and attempted X1 with PT standing in front of pt. Pt continued with posterior lean.   Ambulation/Gait                Stairs             Wheelchair Mobility    Modified Rankin (Stroke Patients Only)       Balance Overall balance assessment: Needs assistance Sitting-balance support: No upper extremity supported;Feet supported Sitting balance-Leahy Scale: Fair     Standing balance support: Bilateral upper extremity supported;During functional activity Standing balance-Leahy Scale: Poor Standing balance comment: Reliant on BUE and external support secondary to posterior lean.                              Pertinent Vitals/Pain Pain Assessment: Faces Faces Pain Scale: No hurt    Home Living Family/patient expects to be discharged to:: Assisted living               Home Equipment: Walker - 2 wheels      Prior Function Level of Independence: Needs assistance   Gait / Transfers Assistance Needed: Pt's daughter reports she sometimes needed assist with ambulation, however, other days did not require much assist.   ADL's / Homemaking Assistance Needed: Required assist for ADLs.         Hand Dominance        Extremity/Trunk Assessment   Upper Extremity Assessment Upper Extremity Assessment: Generalized weakness    Lower Extremity Assessment Lower Extremity Assessment: Generalized weakness    Cervical / Trunk Assessment Cervical / Trunk Assessment: Kyphotic  Communication   Communication: Expressive difficulties  Cognition Arousal/Alertness: Awake/alert Behavior During Therapy: WFL for tasks assessed/performed  Overall Cognitive Status: History of cognitive impairments - at baseline                                 General Comments: Dementia at baseline. Difficulty sequencing noted and required multimodal cues       General Comments General comments (skin integrity, edema, etc.): Pt's daughter and son present during session.     Exercises     Assessment/Plan    PT Assessment Patient needs continued PT services  PT Problem List Decreased strength;Decreased  balance;Decreased mobility;Decreased activity tolerance;Decreased knowledge of use of DME;Decreased knowledge of precautions;Decreased cognition;Decreased safety awareness       PT Treatment Interventions Gait training;DME instruction;Functional mobility training;Therapeutic activities;Therapeutic exercise;Balance training;Cognitive remediation;Patient/family education;Wheelchair mobility training    PT Goals (Current goals can be found in the Care Plan section)  Acute Rehab PT Goals Patient Stated Goal: for pt to return to [facility] per family  PT Goal Formulation: With family Time For Goal Achievement: 02/14/20 Potential to Achieve Goals: Fair    Frequency Min 3X/week   Barriers to discharge        Co-evaluation               AM-PAC PT "6 Clicks" Mobility  Outcome Measure Help needed turning from your back to your side while in a flat bed without using bedrails?: A Little Help needed moving from lying on your back to sitting on the side of a flat bed without using bedrails?: A Little Help needed moving to and from a bed to a chair (including a wheelchair)?: A Lot Help needed standing up from a chair using your arms (e.g., wheelchair or bedside chair)?: A Lot Help needed to walk in hospital room?: Total Help needed climbing 3-5 steps with a railing? : Total 6 Click Score: 12    End of Session Equipment Utilized During Treatment: Gait belt Activity Tolerance: Patient tolerated treatment well Patient left: in bed;with call bell/phone within reach;with family/visitor present (on stretcher) Nurse Communication: Mobility status PT Visit Diagnosis: Unsteadiness on feet (R26.81);Muscle weakness (generalized) (M62.81);Difficulty in walking, not elsewhere classified (R26.2)    Time: 8841-6606 PT Time Calculation (min) (ACUTE ONLY): 33 min   Charges:   PT Evaluation $PT Eval Moderate Complexity: 1 Mod PT Treatments $Therapeutic Activity: 23-37 mins        Lou Miner, DPT  Acute Rehabilitation Services  Pager: [phone] Office: [phone]   Rudean Hitt 01/31/2020, 1:43 PM

## 2020-01-31 NOTE — Progress Notes (Addendum)
1:45pm: CSW sent patient's PT note to Clinton Sawyer at [facility] for review  10:35am: CSW spoke with patient's children [place] and [place] who state they will provide their mother with transportation back to [place].   Please call Clinton Sawyer for report at [phone]. The patient will go to room 6 in the memory care unit. Patient will be transported via family vehicle.  CSW received additional call from Stanton at [facility] requesting this patient be evaluated by PT prior to returning to the facility. The patient will need to be able to ambulate safely with her walker prior to returning.  10am: CSW spoke with Clinton Sawyer in admissions at [facility] [address] who states the patient can return to the ALF today.   CSW informed Lanora Manis, RN and Dr. Earlie Raveling of information.  CSW will facilitate discharge once discharge summary is completed.  Edwin Dada, MSW, LCSW-A Transitions of Care  Clinical Social Worker  [facility] Emergency Departments  Medical ICU [phone]

## 2020-01-31 NOTE — ED Notes (Signed)
ptar called 

## 2020-01-31 NOTE — Progress Notes (Signed)
Will reattempt as time and pt allow.   01/31/20 1000  PT Visit Information  Last PT Received On 01/31/20  Reason Eval/Treat Not Completed Patient at procedure or test/unavailable    Anthea Udovich, PT MS Acute Rehab Dept. Number: [phone] and [phone]  

## 2020-01-31 NOTE — ED Notes (Signed)
RN has paged IM Resident Senior and requested a call back for this pt, r/t decreasing BP. RN will continue to monitor pt

## 2020-02-01 LAB — URINE CULTURE: Culture: 80000 — AB

## 2020-02-02 NOTE — Progress Notes (Signed)
ED Antimicrobial Stewardship Positive Culture Follow Up   Ashlee Briggs is an 86 y.o. female who presented to [facility] on 01/30/2020 with a chief complaint of altered mental status Chief Complaint  Patient presents with  . Altered Mental Status    Recent Results (from the past 720 hour(s))  Urine culture     Status: Abnormal   Collection Time: 01/30/20 11:05 AM   Specimen: Urine, Random  Result Value Ref Range Status   Specimen Description URINE, RANDOM  Final   Special Requests   Final    NONE Performed at [facility], [address], [place], [place] [address]    Culture 80,000 COLONIES/mL STAPHYLOCOCCUS EPIDERMIDIS (A)  Final   Report Status 02/01/2020 FINAL  Final   Organism ID, Bacteria STAPHYLOCOCCUS EPIDERMIDIS (A)  Final      Susceptibility   Staphylococcus epidermidis - MIC*    CIPROFLOXACIN >=8 RESISTANT Resistant     GENTAMICIN <=0.5 SENSITIVE Sensitive     NITROFURANTOIN <=16 SENSITIVE Sensitive     OXACILLIN >=4 RESISTANT Resistant     TETRACYCLINE >=16 RESISTANT Resistant     VANCOMYCIN 1 SENSITIVE Sensitive     TRIMETH/SULFA <=10 SENSITIVE Sensitive     CLINDAMYCIN <=0.25 SENSITIVE Sensitive     RIFAMPIN <=0.5 SENSITIVE Sensitive     Inducible Clindamycin NEGATIVE Sensitive     * 80,000 COLONIES/mL STAPHYLOCOCCUS EPIDERMIDIS  SARS Coronavirus 2 by RT PCR (hospital order, performed in [facility] hospital lab) Nasopharyngeal Nasopharyngeal Swab     Status: None   Collection Time: 01/30/20 11:08 AM   Specimen: Nasopharyngeal Swab  Result Value Ref Range Status   SARS Coronavirus 2 NEGATIVE NEGATIVE Final    Comment: (NOTE) SARS-CoV-2 target nucleic acids are NOT DETECTED.  The SARS-CoV-2 RNA is generally detectable in upper and lower respiratory specimens during the acute phase of infection. The lowest concentration of SARS-CoV-2 viral copies this assay can detect is 250 copies / mL. A negative result does not preclude SARS-CoV-2 infection and  should not be used as the sole basis for treatment or other patient management decisions.  A negative result may occur with improper specimen collection / handling, submission of specimen other than nasopharyngeal swab, presence of viral mutation(s) within the areas targeted by this assay, and inadequate number of viral copies (<250 copies / mL). A negative result must be combined with clinical observations, patient history, and epidemiological information.  Fact Sheet for Patients:   BoilerBrush.com.cy  Fact Sheet for Healthcare Providers: https://pope.com/  This test is not yet approved or  cleared by the [place] FDA and has been authorized for detection and/or diagnosis of SARS-CoV-2 by FDA under an Emergency Use Authorization (EUA).  This EUA will remain in effect (meaning this test can be used) for the duration of the COVID-19 declaration under Section 564(b)(1) of the Act, 21 U.S.C. section 360bbb-3(b)(1), unless the authorization is terminated or revoked sooner.  Performed at [facility], [address] [place], [place] [address]     [x]  Treated with ciprofloxacin, organism resistant to prescribed antimicrobial []  Patient discharged originally without antimicrobial agent and treatment is now indicated  New antibiotic prescription: Nitrofurantoin 100 mg twice daly x 5 days   ED Provider: , PA-C    , PharmD., BCPS, BCCCP Clinical Pharmacist Clinical phone for 02/02/20 until 9:30pm: [phone] If after 9:30pm, please refer to [facility] for unit-specific pharmacist

## 2020-02-03 ENCOUNTER — Telehealth: Payer: Self-pay | Admitting: Emergency Medicine

## 2020-02-03 NOTE — Telephone Encounter (Signed)
Post ED Visit - Positive Culture Follow-up: Successful Patient Follow-Up  Culture assessed and recommendations reviewed by:  []  , Pharm.D. []  Enzo Bi, Pharm.D., BCPS AQ-ID []  , Pharm.D., BCPS []  Celedonio Miyamoto, .D., BCPS []  [place], .D., BCPS, AAHIVP []  Georgina Pillion, Pharm.D., BCPS, AAHIVP []  [address], PharmD, BCPS []  , PharmD, BCPS []  [place], PharmD, BCPS [x]  [address], PharmD  Positive urine culture  []  Patient discharged without antimicrobial prescription and treatment is now indicated [x]  Organism is resistant to prescribed ED discharge antimicrobial []  Patient with positive blood cultures  Changes discussed with ED provider: , PA New antibiotic prescription: Nitrofurantoin 100 mg PO BID x five days Called/faxed to [facility] ) phone# [phone], fax: [phone]  Phillips Climes, date 02/03/20, time 1400   Muadh Creasy C Libni Fusaro 02/03/2020, 4:31 PM

## 2020-04-08 ENCOUNTER — Non-Acute Institutional Stay: Payer: Medicare Other | Admitting: Nurse Practitioner

## 2020-04-08 DIAGNOSIS — Z515 Encounter for palliative care: Secondary | ICD-10-CM

## 2020-04-08 DIAGNOSIS — F0281 Dementia in other diseases classified elsewhere with behavioral disturbance: Secondary | ICD-10-CM

## 2020-04-08 NOTE — Progress Notes (Signed)
[facility] Consult Note Telephone: [phone]  Fax: [phone]  PATIENT NAME: Ashlee Briggs [address] [place] [id number] (home)  DOB: Feb 20, 1928 MRN: [id number]  PRIMARY CARE PROVIDER:    Adin Hector, MD,  [address] [facility] [place] [address] [phone]  REFERRING PROVIDER:  Clide Deutscher, NP  RESPONSIBLE PARTY: Ashlee, Briggs (Son) [phone] [facility]). Ashlee Briggs ([phone]. Ashlee, Briggs [phone]    I met face to face with patient facility.  ASSESSMENT AND RECOMMENDATIONS:   1. Advance Care Planning/Goals of Care: Goals include to maximize quality of life, symptom management and for patient to be content and happy under facility memory care. Patient is a DNR. Form available in facility chart and in [facility] EMR.   2. Symptom Management: At time of visit, patient was lying in bed about to take afternoon nap per staff. Patient has advanced Alzheimer dementia in end stages. Patient dependent on staff for all of her ADls including feeding. Patient has ongoing weight loss in the last 4 months. She is on Megace for appetite stimulation and oral nutritional shakes to supplement meal intake. Patient just completed abx for UTI, no recent hospitalization reported. Discussed and reviewed with daughter Ashlee Briggs regarding trajectory of dementia as a terminal illness, which can result in loss of weight, loss of appetite, worsening decline in cognitive and physical function. Ashlee Briggs verbalized understanding. Discussed recommendation to liberalize patient diet to include snacks as tolerated.  3. Follow up Palliative Care Visit: Palliative care will continue to follow for goals of care clarification and symptom management. Return in 4 weeks or as needed.  4. 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 She has  been married greater than 50yrs to her husband Ashlee Briggs, who moved to [place] so as to eb that he could be closely involved in patient's care. Ashlee Briggs spends majority of the day with her in her memory care unit. Patient has 1 son and 1 daughter both of whom live in [place] and who visit regularly. Grandson age 70 is also a frequent visitor.  5. Cognitive / Functional decline: Patient dependent on staff for all ADls. Staff report that patient husband comes at meal time to assist patient feeding.  I spent 35 minutes providing this consultation,  from 2:45 to 3:20pm. More than 50% of the time in this consultation was spent coordinating communication.   HISTORY OF PRESENT ILLNESS:  Ashlee Briggs is a 86 y.o. year old female with multiple medical problems including late onset Alzheimer's dementia, HTN, PVD, CHF. Palliative Care was asked to follow this patient by consultation request of Ashlee Hector, MD to help address advance care planning and goals of care. This is a follow up visit, last visit was on 01/08/20.  CODE STATUS: DNR  PPS: 40%  HOSPICE ELIGIBILITY/DIAGNOSIS: TBD  PAST MEDICAL HISTORY:  Past Medical History:  Diagnosis Date  . Hyperlipidemia   . Hypertension   . Peripheral vascular disease ([place])     SOCIAL HX:  Social History   Tobacco Use  . Smoking status: Former Smoker    Quit date: 08/14/1978    Years since quitting: 41.6  . Smokeless tobacco: Never Used  Substance Use Topics  . Alcohol use: No   FAMILY HX:  Family History  Family history unknown: Yes    ALLERGIES:  Allergies  Allergen Reactions  . Alendronate Other (  See Comments)  . Risedronate Other (See Comments)  . Sulfa Antibiotics Other (See Comments)     PERTINENT MEDICATIONS:  Outpatient Encounter Medications as of 04/08/2020  Medication Sig  . acetaminophen (TYLENOL) 325 MG tablet Take 650 mg by mouth every 8 (eight) hours as needed for moderate pain.   . busPIRone (BUSPAR) 7.5 MG tablet Take  7.5 mg by mouth 2 (two) times daily as needed (anxiety).   . carvedilol (COREG) 12.5 MG tablet Take 12.5 mg by mouth 2 (two) times daily with a meal.   . Cholecalciferol (VITAMIN D) 50 MCG (2000 UT) tablet Take 2,000 Units by mouth every evening.  . megestrol (MEGACE) 20 MG tablet Take 20 mg by mouth daily.  . melatonin 5 MG TABS Take 10 mg by mouth at bedtime.   . memantine (NAMENDA) 10 MG tablet Take 10 mg by mouth 2 (two) times daily.  . Multiple Vitamin (MULTIVITAMIN) tablet Take 1 tablet by mouth daily.  Marland Kitchen Respiratory Therapy Supplies (FLUTTER) DEVI Use 10-15 times daily  . simvastatin (ZOCOR) 40 MG tablet Take 40 mg by mouth every evening.    No facility-administered encounter medications on file as of 04/08/2020.    PHYSICAL EXAM / ROS:   Current and past weights: current weight 97lbs down from 100.6lbs at last visit. BMI 17.7kg/m2 General: NAD, frail appearing, thin, cooperative with exam Cardiovascular: no chest pain reported, SIS2 auscultated   Pulmonary: no cough, no increased SOB, room air Abdomen: appetite fair, no report of constipation, incontinent of bowel GU: denies dysuria, incontinent of urine MSK:  Compression wraps to bilateral lower legs Skin: pressure to bilateral heals reported, dressing intact Neurological: weakness, but otherwise nonfocal  Ashlee Favre, DNP, AGPCNP-BC

## 2020-04-09 ENCOUNTER — Other Ambulatory Visit: Payer: Self-pay

## 2020-05-08 ENCOUNTER — Encounter (HOSPITAL_BASED_OUTPATIENT_CLINIC_OR_DEPARTMENT_OTHER): Payer: Medicare Other | Attending: Internal Medicine | Admitting: Internal Medicine

## 2020-05-08 DIAGNOSIS — G309 Alzheimer's disease, unspecified: Secondary | ICD-10-CM | POA: Insufficient documentation

## 2020-05-08 DIAGNOSIS — L89620 Pressure ulcer of left heel, unstageable: Secondary | ICD-10-CM | POA: Diagnosis present

## 2020-05-08 DIAGNOSIS — E785 Hyperlipidemia, unspecified: Secondary | ICD-10-CM | POA: Insufficient documentation

## 2020-05-08 DIAGNOSIS — L89523 Pressure ulcer of left ankle, stage 3: Secondary | ICD-10-CM | POA: Diagnosis not present

## 2020-05-08 DIAGNOSIS — I509 Heart failure, unspecified: Secondary | ICD-10-CM | POA: Diagnosis not present

## 2020-05-08 DIAGNOSIS — I739 Peripheral vascular disease, unspecified: Secondary | ICD-10-CM | POA: Diagnosis not present

## 2020-05-08 DIAGNOSIS — F0280 Dementia in other diseases classified elsewhere without behavioral disturbance: Secondary | ICD-10-CM | POA: Diagnosis not present

## 2020-05-08 DIAGNOSIS — Z87891 Personal history of nicotine dependence: Secondary | ICD-10-CM | POA: Insufficient documentation

## 2020-05-08 DIAGNOSIS — L89610 Pressure ulcer of right heel, unstageable: Secondary | ICD-10-CM | POA: Insufficient documentation

## 2020-05-08 DIAGNOSIS — I110 Hypertensive heart disease with heart failure: Secondary | ICD-10-CM | POA: Insufficient documentation

## 2020-05-08 DIAGNOSIS — Z888 Allergy status to other drugs, medicaments and biological substances status: Secondary | ICD-10-CM | POA: Diagnosis not present

## 2020-05-08 DIAGNOSIS — Z882 Allergy status to sulfonamides status: Secondary | ICD-10-CM | POA: Insufficient documentation

## 2020-05-08 DIAGNOSIS — R630 Anorexia: Secondary | ICD-10-CM | POA: Insufficient documentation

## 2020-05-08 DIAGNOSIS — F502 Bulimia nervosa: Secondary | ICD-10-CM | POA: Diagnosis not present

## 2020-05-08 DIAGNOSIS — Z8249 Family history of ischemic heart disease and other diseases of the circulatory system: Secondary | ICD-10-CM | POA: Insufficient documentation

## 2020-05-09 NOTE — Progress Notes (Signed)
Ashlee Briggs ([id number]) Visit Report for 05/08/2020 Abuse/Suicide Risk Screen Details Patient Name: Date of Service: Ashlee Briggs, Ashlee Briggs 05/08/2020 1:15 PM Medical Record Number: [id number] Patient Account Number: [id number] Date of Birth/Sex: Treating RN: 08-13-1927 (86 y.o. Freddy Finner Primary Care Louann Hopson: Daniel Nones Other Clinician: Referring Aliahna Statzer: Treating Carmelite Violet/Extender: Marlowe Sax Weeks in Treatment: 0 Abuse/Suicide Risk Screen Items Answer ABUSE RISK SCREEN: Has anyone close to you tried to hurt or harm you recentlyo No Do you feel uncomfortable with anyone in your familyo No Has anyone forced you do things that you didnt want to doo No Electronic Signature(s) Signed: 05/09/2020 4:20:16 PM By: Yevonne Pax RN Entered By: Yevonne Pax on 05/08/2020 13:40:50 -------------------------------------------------------------------------------- Activities of Daily Living Details Patient Name: Date of Service: Ashlee Briggs 05/08/2020 1:15 PM Medical Record Number: [id number] Patient Account Number: [id number] Date of Birth/Sex: Treating RN: 1927-08-26 (86 y.o. Freddy Finner Primary Care Nation Cradle: Daniel Nones Other Clinician: Referring Isak Sotomayor: Treating Ramell Wacha/Extender: Marlowe Sax Weeks in Treatment: 0 Activities of Daily Living Items Answer Activities of Daily Living (Please select one for each item) Drive Automobile Not Able T Medications ake Not Able Use T elephone Not Able Care for Appearance Not Able Use T oilet Not Able Mady Haagensen / Shower Not Able Dress Self Not Able Feed Self Not Able Walk Not Able Get In / Out Bed Not Able Housework Not Able Prepare Meals Not Able Handle Money Not Able Shop for Self Not Able Electronic Signature(s) Signed: 05/09/2020 4:20:16 PM By: Yevonne Pax RN Entered By: Yevonne Pax on 05/08/2020 13:41:31 -------------------------------------------------------------------------------- Education  Screening Details Patient Name: Date of Service: Ashlee Briggs 05/08/2020 1:15 PM Medical Record Number: [id number] Patient Account Number: [id number] Date of Birth/Sex: Treating RN: 1928-05-22 (86 y.o. Freddy Finner Primary Care Zael Shuman: Daniel Nones Other Clinician: Referring Xavion Muscat: Treating Christopherjame Carnell/Extender: Quentin Mulling in Treatment: 0 Primary Learner Assessed: Caregiver daughter Reason Patient is not Primary Learner: dementia Learning Preferences/Education Level/Primary Language Learning Preference: Explanation Preferred Language: English Cognitive Barrier Language Barrier: No Translator Needed: No Memory Deficit: No Emotional Barrier: No Cultural/Religious Beliefs Affecting Medical Care: No Physical Barrier Impaired Vision: No Impaired Hearing: No Decreased Hand dexterity: No Knowledge/Comprehension Knowledge Level: High Comprehension Level: High Ability to understand written instructions: High Ability to understand verbal instructions: High Motivation Anxiety Level: Calm Cooperation: Cooperative Education Importance: Acknowledges Need Interest in Health Problems: Asks Questions Perception: Coherent Willingness to Engage in Self-Management High Activities: Readiness to Engage in Self-Management High Activities: Electronic Signature(s) Signed: 05/09/2020 4:20:16 PM By: Yevonne Pax RN Entered By: Yevonne Pax on 05/08/2020 13:42:26 -------------------------------------------------------------------------------- Fall Risk Assessment Details Patient Name: Date of Service: Ashlee Briggs 05/08/2020 1:15 PM Medical Record Number: [id number] Patient Account Number: [id number] Date of Birth/Sex: Treating RN: 10-26-1927 (86 y.o. Freddy Finner Primary Care Deshana Rominger: Daniel Nones Other Clinician: Referring Chigozie Basaldua: Treating Wojciech Willetts/Extender: Marlowe Sax Weeks in Treatment: 0 Fall Risk Assessment Items Have you had 2  or more falls in the last 12 monthso 0 No Have you had any fall that resulted in injury in the last 12 monthso 0 No FALLS RISK SCREEN History of falling - immediate or within 3 months 0 No Secondary diagnosis (Do you have 2 or more medical diagnoseso) 0 No Ambulatory aid None/bed rest/wheelchair/nurse 0 Yes Crutches/cane/walker 0 No Furniture 0 No Intravenous therapy Access/Saline/Heparin Lock 0 No Gait/Transferring Normal/ bed rest/ wheelchair 0 Yes Weak (short steps with or without shuffle, stooped but able to lift  head while walking, may seek 0 No support from furniture) Impaired (short steps with shuffle, may have difficulty arising from chair, head down, impaired 0 No balance) Mental Status Oriented to own ability 0 No Electronic Signature(s) Signed: 05/09/2020 4:20:16 PM By: Yevonne Pax RN Entered By: Yevonne Pax on 05/08/2020 13:43:01 -------------------------------------------------------------------------------- Foot Assessment Details Patient Name: Date of Service: Ashlee Briggs 05/08/2020 1:15 PM Medical Record Number: [id number] Patient Account Number: [id number] Date of Birth/Sex: Treating RN: [birth year] (86 y.o. Freddy Finner Primary Care Nicolas Banh: Daniel Nones Other Clinician: Referring Pavielle Biggar: Treating Aarron Wierzbicki/Extender: Marlowe Sax Weeks in Treatment: 0 Foot Assessment Items Site Locations + = Sensation present, - = Sensation absent, C = Callus, U = Ulcer R = Redness, W = Warmth, M = Maceration, PU = Pre-ulcerative lesion F = Fissure, S = Swelling, D = Dryness Assessment Right: Left: Other Deformity: No No Prior Foot Ulcer: No No Prior Amputation: No No Charcot Joint: No No Ambulatory Status: Non-ambulatory Assistance Device: Wheelchair Gait: Surveyor, mining) Signed: 05/09/2020 4:20:16 PM By: Yevonne Pax RN Entered By: Yevonne Pax on 05/08/2020  13:43:43 -------------------------------------------------------------------------------- Nutrition Risk Screening Details Patient Name: Date of ServiceMarland Kitchen Ashlee Briggs 05/08/2020 1:15 PM Medical Record Number: [id number] Patient Account Number: [id number] Date of Birth/Sex: Treating RN: 23-Oct-1927 (86 y.o. Freddy Finner Primary Care Ezio Wieck: Daniel Nones Other Clinician: Referring Kadesha Virrueta: Treating Carlean Crowl/Extender: Cherlyn Cushing, BERT Weeks in Treatment: 0 Height (in): 62 Weight (lbs): 100 Body Mass Index (BMI): 18.3 Nutrition Risk Screening Items Score Screening NUTRITION RISK SCREEN: I have an illness or condition that made me change the kind and/or amount of food I eat 2 Yes I eat fewer than two meals per day 0 No I eat few fruits and vegetables, or milk products 0 No I have three or more drinks of beer, liquor or wine almost every day 0 No I have tooth or mouth problems that make it hard for me to eat 0 No I don't always have enough money to buy the food I need 0 No I eat alone most of the time 0 No I take three or more different prescribed or over-the-counter drugs a day 1 Yes Without wanting to, I have lost or gained 10 pounds in the last six months 2 Yes I am not always physically able to shop, cook and/or feed myself 2 Yes Nutrition Protocols Good Risk Protocol Moderate Risk Protocol High Risk Proctocol 0 Provide education on nutrition Risk Level: High Risk Score: 7 Electronic Signature(s) Signed: 05/09/2020 4:20:16 PM By: Yevonne Pax RN Entered By: Yevonne Pax on 05/08/2020 13:43:23

## 2020-05-09 NOTE — Progress Notes (Signed)
Ashlee Briggs, Ashlee Briggs ([id number]) Visit Report for 05/08/2020 Allergy List Details Patient Name: Date of Service: Ashlee Briggs, Ashlee Briggs 05/08/2020 1:15 PM Medical Record Number: [id number] Patient Account Number: [id number] Date of Birth/Sex: Treating RN: [birth year] (86 y.o. Orvan Falconer Primary Care Nashali Ditmer: [facility] Other Clinician: Referring Keri Tavella: Treating Ameliyah Sarno/Extender: Abran Cantor, BERT Weeks in Treatment: 0 Allergies Active Allergies Actonel Fosamax Sulfa (Sulfonamide Antibiotics) Allergy Notes Electronic Signature(s) Signed: 05/09/2020 4:20:16 PM By: Carlene Coria RN Entered By: Carlene Coria on 05/08/2020 13:37:18 -------------------------------------------------------------------------------- Arrival Information Details Patient Name: Date of Service: Ashlee Briggs, Ashlee Briggs 05/08/2020 1:15 PM Medical Record Number: [id number] Patient Account Number: [id number] Date of Birth/Sex: Treating RN: 07-30-1928 (86 y.o. Orvan Falconer Primary Care Jeremy Mclamb: [facility] Other Clinician: Referring Princessa Lesmeister: Treating Karmen Altamirano/Extender: Romie Minus in Treatment: 0 Visit Information Patient Arrived: Wheel Chair Arrival Time: 13:21 Accompanied By: daughter Transfer Assistance: Manual Patient Identification Verified: Yes Secondary Verification Process Completed: Yes Electronic Signature(s) Signed: 05/09/2020 4:20:16 PM By: Carlene Coria RN Entered By: Carlene Coria on 05/08/2020 13:33:32 -------------------------------------------------------------------------------- Clinic Level of Care Assessment Details Patient Name: Date of Service: Ashlee Briggs, Ashlee Briggs 05/08/2020 1:15 PM Medical Record Number: [id number] Patient Account Number: [id number] Date of Birth/Sex: Treating RN: 07/25/1928 (86 y.o. Debby Bud Primary Care Rosita Guzzetta: [facility] Other Clinician: Referring Demetrias Goodbar: Treating Xavian Hardcastle/Extender: Marlana Salvage Weeks in Treatment:  0 Clinic Level of Care Assessment Items TOOL 4 Quantity Score X- 1 0 Use when only an EandM is performed on FOLLOW-UP visit ASSESSMENTS - Nursing Assessment / Reassessment X- 1 10 Reassessment of Co-morbidities (includes updates in patient status) X- 1 5 Reassessment of Adherence to Treatment Plan ASSESSMENTS - Wound and Skin A ssessment / Reassessment []  - 0 Simple Wound Assessment / Reassessment - one wound X- 3 5 Complex Wound Assessment / Reassessment - multiple wounds X- 1 10 Dermatologic / Skin Assessment (not related to wound area) ASSESSMENTS - Focused Assessment X- 2 5 Circumferential Edema Measurements - multi extremities X- 1 10 Nutritional Assessment / Counseling / Intervention []  - 0 Lower Extremity Assessment (monofilament, tuning fork, pulses) []  - 0 Peripheral Arterial Disease Assessment (using hand held doppler) ASSESSMENTS - Ostomy and/or Continence Assessment and Care []  - 0 Incontinence Assessment and Management []  - 0 Ostomy Care Assessment and Management (repouching, etc.) PROCESS - Coordination of Care []  - 0 Simple Patient / Family Education for ongoing care X- 1 20 Complex (extensive) Patient / Family Education for ongoing care X- 1 10 Staff obtains Programmer, systems, Records, T Results / Process Orders est X- 1 10 Staff telephones HHA, Nursing Homes / Clarify orders / etc []  - 0 Routine Transfer to another Facility (non-emergent condition) []  - 0 Routine Hospital Admission (non-emergent condition) X- 1 15 New Admissions / Biomedical engineer / Ordering NPWT Apligraf, etc. , []  - 0 Emergency Hospital Admission (emergent condition) []  - 0 Simple Discharge Coordination X- 1 15 Complex (extensive) Discharge Coordination PROCESS - Special Needs []  - 0 Pediatric / Minor Patient Management []  - 0 Isolation Patient Management []  - 0 Hearing / Language / Visual special needs []  - 0 Assessment of Community assistance (transportation, D/C  planning, etc.) []  - 0 Additional assistance / Altered mentation []  - 0 Support Surface(s) Assessment (bed, cushion, seat, etc.) INTERVENTIONS - Wound Cleansing / Measurement []  - 0 Simple Wound Cleansing - one wound X- 3 5 Complex Wound Cleansing - multiple wounds X- 1 5 Wound Imaging (photographs - any number of wounds) []  - 0  Wound Tracing (instead of photographs) $RemoveBeforeD'[]'FVQtdlKMkErLgf$  - 0 Simple Wound Measurement - one wound X- 3 5 Complex Wound Measurement - multiple wounds INTERVENTIONS - Wound Dressings $RemoveBeforeD'[]'uzlGHjiMMVIIsB$  - 0 Small Wound Dressing one or multiple wounds X- 2 15 Medium Wound Dressing one or multiple wounds $RemoveBeforeD'[]'TaKbvKNVHYcMVd$  - 0 Large Wound Dressing one or multiple wounds $RemoveBeforeD'[]'OFShTcHPcliDZb$  - 0 Application of Medications - topical $RemoveB'[]'lxyuAZPG$  - 0 Application of Medications - injection INTERVENTIONS - Miscellaneous $RemoveBeforeD'[]'CRipxGhrLZbBKh$  - 0 External ear exam $Remove'[]'ajxxNLw$  - 0 Specimen Collection (cultures, biopsies, blood, body fluids, etc.) $RemoveBefor'[]'drwtElXTeJZO$  - 0 Specimen(s) / Culture(s) sent or taken to Lab for analysis $RemoveBefo'[]'UlgQEQpJKGL$  - 0 Patient Transfer (multiple staff / Civil Service fast streamer / Similar devices) $RemoveBeforeDE'[]'PRWUgbEURHFTKmF$  - 0 Simple Staple / Suture removal (25 or less) $Remove'[]'sFCfRAP$  - 0 Complex Staple / Suture removal (26 or more) $Remove'[]'RzrHfyK$  - 0 Hypo / Hyperglycemic Management (close monitor of Blood Glucose) $RemoveBefore'[]'ulKePBkNJjgyy$  - 0 Ankle / Brachial Index (ABI) - do not check if billed separately X- 1 5 Vital Signs Has the patient been seen at the hospital within the last three years: Yes Total Score: 200 Level Of Care: New/Established - Level 5 Electronic Signature(s) Signed: 05/08/2020 5:01:36 PM By: Deon Pilling Entered By: Deon Pilling on 05/08/2020 14:49:34 -------------------------------------------------------------------------------- Encounter Discharge Information Details Patient Name: Date of Service: Ashlee Briggs, Ashlee Briggs 05/08/2020 1:15 PM Medical Record Number: [id number] Patient Account Number: [id number] Date of Birth/Sex: Treating RN: [birth year] (86 y.o. Elam Dutch Primary Care Sammi Stolarz: [facility]  Other Clinician: Referring Dartanyon Frankowski: Treating Lige Lakeman/Extender: Marlana Salvage Weeks in Treatment: 0 Encounter Discharge Information Items Discharge Condition: Stable Ambulatory Status: Wheelchair Discharge Destination: Other (Note Required) Telephoned: No Orders Sent: Yes Transportation: Other Accompanied By: Charolett Bumpers Schedule Follow-up Appointment: Yes Clinical Summary of Care: Patient Declined Notes ALF memory care unit, facility transportation Electronic Signature(s) Signed: 05/08/2020 4:42:15 PM By: Baruch Gouty RN, BSN Entered By: Baruch Gouty on 05/08/2020 15:15:01 -------------------------------------------------------------------------------- Lower Extremity Assessment Details Patient Name: Date of Service: Ashlee Briggs, Ashlee Briggs 05/08/2020 1:15 PM Medical Record Number: [id number] Patient Account Number: [id number] Date of Birth/Sex: Treating RN: [birth year] (86 y.o. Orvan Falconer Primary Care Larone Kliethermes: [facility] Other Clinician: Referring Westin Knotts: Treating Elaijah Munoz/Extender: Marlana Salvage Weeks in Treatment: 0 Edema Assessment Assessed: [Left: No] [Right: No] Edema: [Left: Yes] [Right: Yes] Calf Left: Right: Point of Measurement: From Medial Instep 27 cm 27 cm Ankle Left: Right: Point of Measurement: From Medial Instep 19 cm 17.5 cm Vascular Assessment Pulses: Dorsalis Pedis Palpable: [Left:Yes] [Right:Yes] Blood Pressure: Brachial: [Left:144] [Right:144] Ankle: [Left:Dorsalis Pedis: 118 0.82] [Right:Dorsalis Pedis: 138 0.96] Electronic Signature(s) Signed: 05/09/2020 4:20:16 PM By: Carlene Coria RN Entered By: Carlene Coria on 05/08/2020 14:04:10 -------------------------------------------------------------------------------- Multi Wound Chart Details Patient Name: Date of Service: Ashlee Briggs, Ashlee Briggs 05/08/2020 1:15 PM Medical Record Number: [id number] Patient Account Number: [id number] Date of Birth/Sex: Treating RN: 03-29-1928  (86 y.o. F) Primary Care Verenise Moulin: [facility] Other Clinician: Referring Jessilynn Taft: Treating Kaya Pottenger/Extender: Abran Cantor, BERT Weeks in Treatment: 0 Vital Signs Height(in): 62 Pulse(bpm): 98 Weight(lbs): 100 Blood Pressure(mmHg): 144/70 Body Mass Index(BMI): 18 Temperature(F): 98.4 Respiratory Rate(breaths/min): 19 Photos: [1:No Photos Left Calcaneus] [2:No Photos Left, Medial Ankle] [3:No Photos Right, Lateral Calcaneus] Wound Location: [1:Gradually Appeared] [2:Gradually Appeared] [3:Gradually Appeared] Wounding Event: [1:Pressure Ulcer] [2:Pressure Ulcer] [3:Pressure Ulcer] Primary Etiology: [1:Congestive Heart Failure,] [2:Congestive Heart Failure,] [3:Congestive Heart Failure,] Comorbid History: [1:Hypertension, Peripheral Venous Disease, Dementia, Anorexia/bulimia 12/08/2019] [2:Hypertension, Peripheral Venous Disease, Dementia, Anorexia/bulimia Disease, Dementia, Anorexia/bulimia 12/26/2019] [3:Hypertension, Peripheral Venous  01/16/2020] Date Acquired: [  1:0] [2:0] [3:0] Weeks of Treatment: [1:Open] [2:Open] [3:Open] Wound Status: [1:5.2x5.8x0.1] [2:1.7x2x0.1] [3:3x3.7x0.1] Measurements L x W x D (cm) [1:23.688] [2:2.67] [3:8.718] A (cm) : rea [1:2.369] [2:0.267] [3:0.872] Volume (cm) : [1:Unstageable/Unclassified] [2:Category/Stage III] [3:Unstageable/Unclassified] Classification: [1:Small] [2:Medium] [3:Small] Exudate A mount: [1:Serous] [2:Serous] [3:Serous] Exudate Type: [1:amber] [2:amber] [3:amber] Exudate Color: [1:Distinct, outline attached] [2:Distinct, outline attached] [3:Distinct, outline attached] Wound Margin: [1:None Present (0%)] [2:Small (1-33%)] [3:None Present (0%)] Granulation A mount: [1:N/A] [2:Pink] [3:N/A] Granulation Quality: [1:Large (67-100%)] [2:Large (67-100%)] [3:Large (67-100%)] Necrotic A mount: [1:Eschar, Adherent Slough] [2:Adherent Slough] [3:Eschar, Adherent Slough] Necrotic Tissue: [1:Fascia: No] [2:Fat Layer (Subcutaneous  Tissue): Yes Fascia: No] Exposed Structures: [1:Fat Layer (Subcutaneous Tissue): No Tendon: No Muscle: No Joint: No Bone: No None] [2:Fascia: No Tendon: No Muscle: No Joint: No Bone: No None] [3:Fat Layer (Subcutaneous Tissue): No Tendon: No Muscle: No Joint: No Bone: No None] Treatment Notes Electronic Signature(s) Signed: 05/08/2020 4:51:06 PM By: Linton Ham MD Entered By: Linton Ham on 05/08/2020 14:57:27 -------------------------------------------------------------------------------- Multi-Disciplinary Care Plan Details Patient Name: Date of Service: Ashlee Briggs, Ashlee Briggs 05/08/2020 1:15 PM Medical Record Number: [id number] Patient Account Number: [id number] Date of Birth/Sex: Treating RN: August 31, 1927 (86 y.o. Debby Bud Primary Care Cedra Villalon: [facility] Other Clinician: Referring Janzen Sacks: Treating Trevaun Rendleman/Extender: Marlana Salvage Weeks in Treatment: 0 Active Inactive Abuse / Safety / Falls / Self Care Management Nursing Diagnoses: Impaired physical mobility Potential for falls Goals: Patient will remain injury free related to falls Date Initiated: 05/08/2020 Target Resolution Date: 06/13/2020 Goal Status: Active Interventions: Provide education on fall prevention Notes: Nutrition Nursing Diagnoses: Potential for alteratiion in Nutrition/Potential for imbalanced nutrition Goals: Patient/caregiver agrees to and verbalizes understanding of need to obtain nutritional consultation Date Initiated: 05/08/2020 Target Resolution Date: 05/16/2020 Goal Status: Active Interventions: Provide education on nutrition Treatment Activities: Patient referred to Primary Care Physician for further nutritional evaluation : 05/08/2020 Notes: Orientation to the Wound Care Program Nursing Diagnoses: Knowledge deficit related to the wound healing center program Goals: Patient/caregiver will verbalize understanding of the [facility] Program Date Initiated:  05/08/2020 Target Resolution Date: 05/15/2020 Goal Status: Active Interventions: Provide education on orientation to the wound center Notes: Pain, Acute or Chronic Nursing Diagnoses: Pain, acute or chronic: actual or potential Potential alteration in comfort, pain Goals: Patient will verbalize adequate pain control and receive pain control interventions during procedures as needed Date Initiated: 05/08/2020 Target Resolution Date: 05/16/2020 Goal Status: Active Patient/caregiver will verbalize comfort level met Date Initiated: 05/08/2020 Target Resolution Date: 06/06/2020 Goal Status: Active Interventions: Provide education on pain management Reposition patient for comfort Treatment Activities: Administer pain control measures as ordered : 05/08/2020 Notes: Wound/Skin Impairment Nursing Diagnoses: Knowledge deficit related to ulceration/compromised skin integrity Goals: Patient/caregiver will verbalize understanding of skin care regimen Date Initiated: 05/08/2020 Target Resolution Date: 05/16/2020 Goal Status: Active Interventions: Assess patient/caregiver ability to obtain necessary supplies Assess patient/caregiver ability to perform ulcer/skin care regimen upon admission and as needed Provide education on ulcer and skin care Treatment Activities: Skin care regimen initiated : 05/08/2020 Topical wound management initiated : 05/08/2020 Notes: Electronic Signature(s) Signed: 05/08/2020 5:01:36 PM By: Deon Pilling Entered By: Deon Pilling on 05/08/2020 13:55:45 -------------------------------------------------------------------------------- Pain Assessment Details Patient Name: Date of ServiceELVIRA, Ashlee Briggs 05/08/2020 1:15 PM Medical Record Number: [id number] Patient Account Number: [id number] Date of Birth/Sex: Treating RN: 1928-05-11 (86 y.o. Orvan Falconer Primary Care Hanna Ra: [facility] Other Clinician: Referring Ainslee Sou: Treating Nicoya Friel/Extender: Marlana Salvage Weeks in Treatment: 0 Active Problems Location  of Pain Severity and Description of Pain Patient Has Paino Patient Unable to Respond Site Locations Pain Management and Medication Current Pain Management: Electronic Signature(s) Signed: 05/09/2020 4:20:16 PM By: Carlene Coria RN Entered By: Carlene Coria on 05/08/2020 14:09:04 -------------------------------------------------------------------------------- Patient/Caregiver Education Details Patient Name: Date of Service: Delphia Grates 9/30/2021andnbsp1:15 PM Medical Record Number: [id number] Patient Account Number: [id number] Date of Birth/Gender: Treating RN: 10-Apr-1928 (86 y.o. Debby Bud Primary Care Physician: [facility] Other Clinician: Referring Physician: Treating Physician/Extender: Romie Minus in Treatment: 0 Education Assessment Education Provided To: Caregiver Education Topics Provided Welcome T [facility]: o Handouts: Welcome T [facility] o Methods: Explain/Verbal, Printed Responses: Reinforcements needed Electronic Signature(s) Signed: 05/08/2020 5:01:36 PM By: Deon Pilling Entered By: Deon Pilling on 05/08/2020 13:55:56 -------------------------------------------------------------------------------- Wound Assessment Details Patient Name: Date of Service: Ashlee Briggs, Ashlee Briggs 05/08/2020 1:15 PM Medical Record Number: [id number] Patient Account Number: [id number] Date of Birth/Sex: Treating RN: [birth year] (86 y.o. Orvan Falconer Primary Care Ava Deguire: [facility] Other Clinician: Referring Savan Ruta: Treating Damel Querry/Extender: Marlana Salvage Weeks in Treatment: 0 Wound Status Wound Number: 1 Primary Pressure Ulcer Etiology: Wound Location: Left Calcaneus Wound Open Wounding Event: Gradually Appeared Status: Date Acquired: 12/08/2019 Comorbid Congestive Heart Failure, Hypertension, Peripheral Venous Weeks Of Treatment:  0 History: Disease, Dementia, Anorexia/bulimia Clustered Wound: No Wound Measurements Length: (cm) 5.2 Width: (cm) 5.8 Depth: (cm) 0.1 Area: (cm) 23.688 Volume: (cm) 2.369 % Reduction in Area: % Reduction in Volume: Epithelialization: None Tunneling: No Undermining: No Wound Description Classification: Unstageable/Unclassified Wound Margin: Distinct, outline attached Exudate Amount: Small Exudate Type: Serous Exudate Color: amber Foul Odor After Cleansing: No Slough/Fibrino Yes Wound Bed Granulation Amount: None Present (0%) Exposed Structure Necrotic Amount: Large (67-100%) Fascia Exposed: No Necrotic Quality: Eschar, Adherent Slough Fat Layer (Subcutaneous Tissue) Exposed: No Tendon Exposed: No Muscle Exposed: No Joint Exposed: No Bone Exposed: No Treatment Notes Wound #1 (Left Calcaneus) 3. Primary Dressing Applied Other primary dressing (specifiy in notes) 4. Secondary Dressing Dry Gauze Roll Gauze Heel Cup 7. Footwear/Offloading device applied Other footwear/offloading device (specify in notes) Notes medihoney alginate and bunny boots Electronic Signature(s) Signed: 05/09/2020 4:20:16 PM By: Carlene Coria RN Entered By: Carlene Coria on 05/08/2020 14:06:04 -------------------------------------------------------------------------------- Wound Assessment Details Patient Name: Date of Service: Ashlee Briggs, Ashlee Briggs 05/08/2020 1:15 PM Medical Record Number: [id number] Patient Account Number: [id number] Date of Birth/Sex: Treating RN: 1927/10/16 (86 y.o. Orvan Falconer Primary Care Hampton Cost: [facility] Other Clinician: Referring Sonni Barse: Treating Maico Mulvehill/Extender: Marlana Salvage Weeks in Treatment: 0 Wound Status Wound Number: 2 Primary Pressure Ulcer Etiology: Wound Location: Left, Medial Ankle Wound Open Wounding Event: Gradually Appeared Status: Date Acquired: 12/26/2019 Comorbid Congestive Heart Failure, Hypertension, Peripheral  Venous Weeks Of Treatment: 0 History: Disease, Dementia, Anorexia/bulimia Clustered Wound: No Wound Measurements Length: (cm) 1.7 Width: (cm) 2 Depth: (cm) 0.1 Area: (cm) 2.67 Volume: (cm) 0.267 % Reduction in Area: % Reduction in Volume: Epithelialization: None Tunneling: No Undermining: No Wound Description Classification: Category/Stage III Wound Margin: Distinct, outline attached Exudate Amount: Medium Exudate Type: Serous Exudate Color: amber Foul Odor After Cleansing: No Slough/Fibrino Yes Wound Bed Granulation Amount: Small (1-33%) Exposed Structure Granulation Quality: Pink Fascia Exposed: No Necrotic Amount: Large (67-100%) Fat Layer (Subcutaneous Tissue) Exposed: Yes Necrotic Quality: Adherent Slough Tendon Exposed: No Muscle Exposed: No Joint Exposed: No Bone Exposed: No Treatment Notes Wound #2 (Left, Medial Ankle) 3. Primary Dressing Applied Other primary dressing (specifiy in notes) 4. Secondary Dressing Dry  Gauze Roll Gauze Heel Cup 7. Footwear/Offloading device applied Other footwear/offloading device (specify in notes) Notes medihoney alginate and bunny boots Electronic Signature(s) Signed: 05/09/2020 4:20:16 PM By: Carlene Coria RN Entered By: Carlene Coria on 05/08/2020 14:07:40 -------------------------------------------------------------------------------- Wound Assessment Details Patient Name: Date of Service: Ashlee Briggs, Ashlee Briggs 05/08/2020 1:15 PM Medical Record Number: [id number] Patient Account Number: [id number] Date of Birth/Sex: Treating RN: 1927-12-29 (86 y.o. Orvan Falconer Primary Care Janya Eveland: [facility] Other Clinician: Referring Atiana Levier: Treating Dekari Bures/Extender: Marlana Salvage Weeks in Treatment: 0 Wound Status Wound Number: 3 Primary Pressure Ulcer Etiology: Wound Location: Right, Lateral Calcaneus Wound Open Wounding Event: Gradually Appeared Status: Date Acquired: 01/16/2020 Comorbid Congestive  Heart Failure, Hypertension, Peripheral Venous Weeks Of Treatment: 0 History: Disease, Dementia, Anorexia/bulimia Clustered Wound: No Wound Measurements Length: (cm) 3 Width: (cm) 3.7 Depth: (cm) 0.1 Area: (cm) 8.718 Volume: (cm) 0.872 % Reduction in Area: % Reduction in Volume: Epithelialization: None Tunneling: No Undermining: No Wound Description Classification: Unstageable/Unclassified Wound Margin: Distinct, outline attached Exudate Amount: Small Exudate Type: Serous Exudate Color: amber Foul Odor After Cleansing: No Slough/Fibrino Yes Wound Bed Granulation Amount: None Present (0%) Exposed Structure Necrotic Amount: Large (67-100%) Fascia Exposed: No Necrotic Quality: Eschar, Adherent Slough Fat Layer (Subcutaneous Tissue) Exposed: No Tendon Exposed: No Muscle Exposed: No Joint Exposed: No Bone Exposed: No Treatment Notes Wound #3 (Right, Lateral Calcaneus) 3. Primary Dressing Applied Other primary dressing (specifiy in notes) 4. Secondary Dressing Dry Gauze Roll Gauze Heel Cup 7. Footwear/Offloading device applied Other footwear/offloading device (specify in notes) Notes medihoney alginate and bunny boots Electronic Signature(s) Signed: 05/09/2020 4:20:16 PM By: Carlene Coria RN Entered By: Carlene Coria on 05/08/2020 14:08:45 -------------------------------------------------------------------------------- Vitals Details Patient Name: Date of Service: JENNIER, SCHISSLER 05/08/2020 1:15 PM Medical Record Number: [id number] Patient Account Number: [id number] Date of Birth/Sex: Treating RN: 09-22-1927 (86 y.o. Orvan Falconer Primary Care Kathlyne Loud: [facility] Other Clinician: Referring Dea Bitting: Treating Anhelica Fowers/Extender: Marlana Salvage Weeks in Treatment: 0 Vital Signs Time Taken: 13:33 Temperature (F): 98.4 Height (in): 62 Pulse (bpm): 98 Weight (lbs): 100 Respiratory Rate (breaths/min): 19 Body Mass Index (BMI): 18.3 Blood  Pressure (mmHg): 144/70 Reference Range: 80 - 120 mg / dl Electronic Signature(s) Signed: 05/09/2020 4:20:16 PM By: Carlene Coria RN Entered By: Carlene Coria on 05/08/2020 13:35:05

## 2020-05-09 NOTE — Progress Notes (Signed)
Ashlee Briggs, Ashlee Briggs ([id number]) Visit Report for 05/08/2020 Chief Complaint Document Details Patient Name: Date of Service: Ashlee Briggs 05/08/2020 1:15 PM Medical Record Number: [id number] Patient Account Number: [id number] Date of Birth/Sex: Treating RN: 03-06-1928 (86 y.o. F) Primary Care Provider: Daniel Nones Other Clinician: Referring Provider: Treating Provider/Extender: Cherlyn Cushing, Doristine Church Weeks in Treatment: 0 Information Obtained from: Patient Chief Complaint 05/08/2020; patient is here for review of pressure ulcers on her bilateral heels and left ankle Electronic Signature(s) Signed: 05/08/2020 4:51:06 PM By: Baltazar Najjar MD Entered By: Baltazar Najjar on 05/08/2020 14:57:54 -------------------------------------------------------------------------------- HPI Details Patient Name: Date of Service: Ashlee Briggs, Ashlee Briggs 05/08/2020 1:15 PM Medical Record Number: [id number] Patient Account Number: [id number] Date of Birth/Sex: Treating RN: 1927/12/10 (86 y.o. F) Primary Care Provider: Daniel Nones Other Clinician: Referring Provider: Treating Provider/Extender: Marlowe Sax Weeks in Treatment: 0 History of Present Illness HPI Description: ADMISSION 05/08/2020 This is a 86 year old woman who is a resident of [facility] since April of this year. She has Alzheimer's disease. Since she went to the facility things have not gone particularly well. She developed a pressure ulcer on her left heel. Daughter thinks in May and subsequently has developed 1 on the right heel and then on the left medial ankle. The right heel about a month later and the left medial ankle more recently. The facility has been using Medihoney alginate Kerlix and Coban. This is a reasonable dressing they have [facility] changing this 3 times a week. She does not appear to be in a lot of pain. Her daughter reports that should her eating is marginal but she does not  feel like she is lost a lot of weight. Past medical history includes Alzheimer's disease, hypertension, hyperlipidemia, congestive heart failure, peripheral vascular disease. ABIs in our clinic were 0.96 on the right and 0.82 on the left Electronic Signature(s) Signed: 05/08/2020 4:51:06 PM By: Baltazar Najjar MD Entered By: Baltazar Najjar on 05/08/2020 14:59:37 -------------------------------------------------------------------------------- Physical Exam Details Patient Name: Date of Service: Ashlee Briggs, Ashlee Briggs 05/08/2020 1:15 PM Medical Record Number: [id number] Patient Account Number: [id number] Date of Birth/Sex: Treating RN: [birth year] (86 y.o. F) Primary Care Provider: Daniel Nones Other Clinician: Referring Provider: Treating Provider/Extender: Cherlyn Cushing, BERT Weeks in Treatment: 0 Constitutional Sitting or standing Blood Pressure is within target range for patient.. Pulse regular and within target range for patient.Marland Kitchen Respirations regular, non-labored and within target range.. Temperature is normal and within the target range for the patient.Marland Kitchen Appears in no distress. Respiratory work of breathing is normal. Cardiovascular Heart rhythm and rate regular, without murmur or gallop. Perhaps somewhat dehydrated. Pedal pulses are palpable at the dorsalis pedis bilaterally. She appears to have a robust posterior tibial on the left but not the right although it is palpable.. Integumentary (Hair, Skin) There is no erythema around the wounds. Psychiatric Severe Alzheimer's disease dementia. Notes 05/08/2020; the patient has 3 wound areas. The larger one is on the tip of her left heel 100% covered by thick black eschar. Around the perimeter it looks as though there has been some healing. Similar position of the area on the right lateral calcaneus. There is no tenderness around these wounds and no erythema. She has a better looking wound which is full-thickness on the left medial  ankle. There is some slough on the surface of this. I did no debridement on any area Electronic Signature(s) Signed: 05/08/2020 4:51:06 PM By: Baltazar Najjar MD Entered By: Baltazar Najjar on 05/08/2020 15:01:48 --------------------------------------------------------------------------------  Physician Orders Details Patient Name: Date of Service: Briggs, Ashlee 05/08/2020 1:15 PM Medical Record Number: [id number] Patient Account Number: [id number] Date of Birth/Sex: Treating RN: 1927-08-15 (86 y.o. Arta Silence Primary Care Provider: Daniel Nones Other Clinician: Referring Provider: Treating Provider/Extender: Marlowe Sax Weeks in Treatment: 0 Verbal / Phone Orders: No Diagnosis Coding Follow-up Appointments Return Appointment in 1 week. Dressing Change Frequency Change dressing three times week. Wound Cleansing Clean wound with Wound Cleanser - or normal saline Primary Wound Dressing Wound #1 Left Calcaneus Medihoney Alginate Wound #2 Left,Medial Ankle Medihoney Alginate Wound #3 Right,Lateral Calcaneus Medihoney Alginate Secondary Dressing Wound #1 Left Calcaneus Kerlix/Rolled Gauze Heel Cup Wound #3 Right,Lateral Calcaneus Kerlix/Rolled Gauze Heel Cup Wound #2 Left,Medial Ankle Dry Gauze Edema Control Elevate legs to the level of the heart or above for 30 minutes daily and/or when sitting, a frequency of: - throughout the day. Off-Loading Other: - float heels with bunny boots or pillows while resting in chair or bed. Additional Orders / Instructions Other: - patient's family to discuss options concerning wound care for patient. Home Health Continue Home Health skilled nursing for wound care. [facility]. Electronic Signature(s) Signed: 05/08/2020 4:51:06 PM By: Baltazar Najjar MD Signed: 05/08/2020 5:01:36 PM By: Shawn Stall Entered By: Shawn Stall on 05/08/2020  14:48:39 -------------------------------------------------------------------------------- Problem List Details Patient Name: Date of Service: Ashlee Briggs, Ashlee Briggs 05/08/2020 1:15 PM Medical Record Number: [id number] Patient Account Number: [id number] Date of Birth/Sex: Treating RN: September 03, 1927 (86 y.o. F) Primary Care Provider: Daniel Nones Other Clinician: Referring Provider: Treating Provider/Extender: Cherlyn Cushing, Doristine Church Weeks in Treatment: 0 Active Problems ICD-10 Encounter Code Description Active Date MDM Diagnosis L89.620 Pressure ulcer of left heel, unstageable 05/08/2020 No Yes L89.610 Pressure ulcer of right heel, unstageable 05/08/2020 No Yes L89.523 Pressure ulcer of left ankle, stage 3 05/08/2020 No Yes G30.9 Alzheimer's disease, unspecified 05/08/2020 No Yes Inactive Problems Resolved Problems Electronic Signature(s) Signed: 05/08/2020 4:51:06 PM By: Baltazar Najjar MD Entered By: Baltazar Najjar on 05/08/2020 14:57:08 -------------------------------------------------------------------------------- Progress Note Details Patient Name: Date of Service: SHASHA, BUCHBINDER 05/08/2020 1:15 PM Medical Record Number: [id number] Patient Account Number: [id number] Date of Birth/Sex: Treating RN: 04-Nov-1927 (86 y.o. F) Primary Care Provider: Daniel Nones Other Clinician: Referring Provider: Treating Provider/Extender: Cherlyn Cushing, Doristine Church Weeks in Treatment: 0 Subjective Chief Complaint Information obtained from Patient 05/08/2020; patient is here for review of pressure ulcers on her bilateral heels and left ankle History of Present Illness (HPI) ADMISSION 05/08/2020 This is a 86 year old woman who is a resident of [facility] since April of this year. She has Alzheimer's disease. Since she went to the facility things have not gone particularly well. She developed a pressure ulcer on her left heel. Daughter thinks in May and subsequently has  developed 1 on the right heel and then on the left medial ankle. The right heel about a month later and the left medial ankle more recently. The facility has been using Medihoney alginate Kerlix and Coban. This is a reasonable dressing they have [facility] changing this 3 times a week. She does not appear to be in a lot of pain. Her daughter reports that should her eating is marginal but she does not feel like she is lost a lot of weight. Past medical history includes Alzheimer's disease, hypertension, hyperlipidemia, congestive heart failure, peripheral vascular disease. ABIs in our clinic were 0.96 on the right and 0.82 on the left Patient History Unable to Obtain  Patient History due to Dementia. Allergies Actonel, Fosamax, Sulfa (Sulfonamide Antibiotics) Family History Heart Disease - Father,Siblings, Hypertension - Father,Siblings, No family history of Cancer, Diabetes, Hereditary Spherocytosis, Kidney Disease, Lung Disease, Seizures, Stroke, Thyroid Problems, Tuberculosis. Social History Former smoker, Marital Status - Married, Alcohol Use - Never, Drug Use - No History, Caffeine Use - Daily. Medical History Cardiovascular Patient has history of Congestive Heart Failure, Hypertension, Peripheral Venous Disease Integumentary (Skin) Denies history of History of Burn Neurologic Patient has history of Dementia Psychiatric Patient has history of Anorexia/bulimia Medical A Surgical History Notes nd Neurologic Advanced Alzheimer's Disease Anorexia Review of Systems (ROS) Constitutional Symptoms (General Health) Denies complaints or symptoms of Fatigue, Fever, Chills, Marked Weight Change. Eyes Denies complaints or symptoms of Dry Eyes, Vision Changes, Glasses / Contacts. Ear/Nose/Mouth/Throat Denies complaints or symptoms of Chronic sinus problems or rhinitis. Respiratory Denies complaints or symptoms of Chronic or frequent coughs, Shortness of  Breath. Gastrointestinal Denies complaints or symptoms of Frequent diarrhea, Nausea, Vomiting. Endocrine Denies complaints or symptoms of Heat/cold intolerance. Genitourinary Denies complaints or symptoms of Frequent urination. Integumentary (Skin) Complains or has symptoms of Wounds - bilateral heels. Musculoskeletal Denies complaints or symptoms of Muscle Pain, Muscle Weakness. Objective Constitutional Sitting or standing Blood Pressure is within target range for patient.. Pulse regular and within target range for patient.Marland Kitchen. Respirations regular, non-labored and within target range.. Temperature is normal and within the target range for the patient.Marland Kitchen. Appears in no distress. Vitals Time Taken: 1:33 PM, Height: 62 in, Weight: 100 lbs, BMI: 18.3, Temperature: 98.4 F, Pulse: 98 bpm, Respiratory Rate: 19 breaths/min, Blood Pressure: 144/70 mmHg. Respiratory work of breathing is normal. Cardiovascular Heart rhythm and rate regular, without murmur or gallop. Perhaps somewhat dehydrated. Pedal pulses are palpable at the dorsalis pedis bilaterally. She appears to have a robust posterior tibial on the left but not the right although it is palpable.Marland Kitchen. Psychiatric Severe Alzheimer's disease dementia. General Notes: 05/08/2020; the patient has 3 wound areas. The larger one is on the tip of her left heel 100% covered by thick black eschar. Around the perimeter it looks as though there has been some healing. Similar position of the area on the right lateral calcaneus. There is no tenderness around these wounds and no erythema. She has a better looking wound which is full-thickness on the left medial ankle. There is some slough on the surface of this. I did no debridement on any area Integumentary (Hair, Skin) There is no erythema around the wounds. Wound #1 status is Open. Original cause of wound was Gradually Appeared. The wound is located on the Left Calcaneus. The wound measures 5.2cm length  x 5.8cm width x 0.1cm depth; 23.688cm^2 area and 2.369cm^3 volume. There is no tunneling or undermining noted. There is a small amount of serous drainage noted. The wound margin is distinct with the outline attached to the wound base. There is no granulation within the wound bed. There is a large (67-100%) amount of necrotic tissue within the wound bed including Eschar and Adherent Slough. Wound #2 status is Open. Original cause of wound was Gradually Appeared. The wound is located on the Left,Medial Ankle. The wound measures 1.7cm length x 2cm width x 0.1cm depth; 2.67cm^2 area and 0.267cm^3 volume. There is Fat Layer (Subcutaneous Tissue) exposed. There is no tunneling or undermining noted. There is a medium amount of serous drainage noted. The wound margin is distinct with the outline attached to the wound base. There is small (1-33%) pink granulation within the wound  bed. There is a large (67-100%) amount of necrotic tissue within the wound bed including Adherent Slough. Wound #3 status is Open. Original cause of wound was Gradually Appeared. The wound is located on the Right,Lateral Calcaneus. The wound measures 3cm length x 3.7cm width x 0.1cm depth; 8.718cm^2 area and 0.872cm^3 volume. There is no tunneling or undermining noted. There is a small amount of serous drainage noted. The wound margin is distinct with the outline attached to the wound base. There is no granulation within the wound bed. There is a large (67- 100%) amount of necrotic tissue within the wound bed including Eschar and Adherent Slough. Assessment Active Problems ICD-10 Pressure ulcer of left heel, unstageable Pressure ulcer of right heel, unstageable Pressure ulcer of left ankle, stage 3 Alzheimer's disease, unspecified Plan Follow-up Appointments: Return Appointment in 1 week. Dressing Change Frequency: Change dressing three times week. Wound Cleansing: Clean wound with Wound Cleanser - or normal saline Primary  Wound Dressing: Wound #1 Left Calcaneus: Medihoney Alginate Wound #2 Left,Medial Ankle: Medihoney Alginate Wound #3 Right,Lateral Calcaneus: Medihoney Alginate Secondary Dressing: Wound #1 Left Calcaneus: Kerlix/Rolled Gauze Heel Cup Wound #3 Right,Lateral Calcaneus: Kerlix/Rolled Gauze Heel Cup Wound #2 Left,Medial Ankle: Dry Gauze Edema Control: Elevate legs to the level of the heart or above for 30 minutes daily and/or when sitting, a frequency of: - throughout the day. Off-Loading: Other: - float heels with bunny boots or pillows while resting in chair or bed. Additional Orders / Instructions: Other: - patient's family to discuss options concerning wound care for patient. Home Health: Continue Home Health skilled nursing for wound care. [facility]. 1. This patient has unstageable pressure ulcers on both heels and a stage III wound on her left medial ankle. 2 by history provided by her daughter who is a Designer, jewellery, she is no longer ambulatory and may be eating marginally. I have had an ethical discussion with her. I am more than willing to attempt to be aggressive with these areas if the family wishes me to do this. I will mean multiple visits to this clinic over the next 2 to 3 months. We would have to start with debridement, crosshatching probably Iodoflex as we can only get home health and 3 times a week. Once we see what is under the eschar then will know how much damage we have to deal with after that. I have told her there is no way to know this until we move forward with attempted debridement. 3. Her arterial evaluation in our clinic was not 100% normal but she has a brisk polyp posterior tibial pulse on the left and a palpable 1 on the right her ABIs are reasonable. It seems likely that she has enough blood flow to heal these. I do not think the family wants to go through formal arterial evaluation. They would not see her as a candidate for angiography,  percutaneous evaluations etc. 4. If they elect not to be aggressive with this and I think Medihoney alginate is a good palliative dressing. As I stated it almost looks like she has had some degree of healing around the edges of the heel wounds 5. I saw no evidence of infection. No empiric antibiotics. I did not think that an x-ray was necessary at this point I spent 35 minutes in review of this patient's past medical history, face-to-face evaluation and preparation of this record. We will have her back next week to discuss the approach here I think her son wants to  be involved as well. She also has a 65 year old husband who apparently is quite well Psychologist, prison and probation services) Signed: 05/08/2020 4:51:06 PM By: Baltazar Najjar MD Entered By: Baltazar Najjar on 05/08/2020 15:05:14 -------------------------------------------------------------------------------- HxROS Details Patient Name: Date of Service: Ashlee Briggs, Ashlee Briggs 05/08/2020 1:15 PM Medical Record Number: [id number] Patient Account Number: [id number] Date of Birth/Sex: Treating RN: 07-07-1928 (86 y.o. Freddy Finner Primary Care Provider: Daniel Nones Other Clinician: Referring Provider: Treating Provider/Extender: Marlowe Sax Weeks in Treatment: 0 Unable to Obtain Patient History due to Dementia Constitutional Symptoms (General Health) Complaints and Symptoms: Negative for: Fatigue; Fever; Chills; Marked Weight Change Eyes Complaints and Symptoms: Negative for: Dry Eyes; Vision Changes; Glasses / Contacts Ear/Nose/Mouth/Throat Complaints and Symptoms: Negative for: Chronic sinus problems or rhinitis Respiratory Complaints and Symptoms: Negative for: Chronic or frequent coughs; Shortness of Breath Gastrointestinal Complaints and Symptoms: Negative for: Frequent diarrhea; Nausea; Vomiting Endocrine Complaints and Symptoms: Negative for: Heat/cold intolerance Genitourinary Complaints and Symptoms: Negative for:  Frequent urination Integumentary (Skin) Complaints and Symptoms: Positive for: Wounds - bilateral heels Medical History: Negative for: History of Burn Musculoskeletal Complaints and Symptoms: Negative for: Muscle Pain; Muscle Weakness Hematologic/Lymphatic Cardiovascular Medical History: Positive for: Congestive Heart Failure; Hypertension; Peripheral Venous Disease Immunological Neurologic Medical History: Positive for: Dementia Past Medical History Notes: Advanced Alzheimer's Disease Anorexia Oncologic Psychiatric Medical History: Positive for: Anorexia/bulimia Immunizations Pneumococcal Vaccine: Received Pneumococcal Vaccination: Yes Implantable Devices None Family and Social History Cancer: No; Diabetes: No; Heart Disease: Yes - Father,Siblings; Hereditary Spherocytosis: No; Hypertension: Yes - Father,Siblings; Kidney Disease: No; Lung Disease: No; Seizures: No; Stroke: No; Thyroid Problems: No; Tuberculosis: No; Former smoker; Marital Status - Married; Alcohol Use: Never; Drug Use: No History; Caffeine Use: Daily; Financial Concerns: No; Food, Clothing or Shelter Needs: No; Support System Lacking: No; Transportation Concerns: No Electronic Signature(s) Signed: 05/08/2020 4:51:06 PM By: Baltazar Najjar MD Signed: 05/09/2020 4:20:16 PM By: Yevonne Pax RN Entered By: Yevonne Pax on 05/08/2020 13:40:43 -------------------------------------------------------------------------------- SuperBill Details Patient Name: Date of Service: Ashlee Briggs, Ashlee Briggs 05/08/2020 Medical Record Number: [id number] Patient Account Number: [id number] Date of Birth/Sex: Treating RN: [birth year] (86 y.o. F) Primary Care Provider: Daniel Nones Other Clinician: Referring Provider: Treating Provider/Extender: Cherlyn Cushing, Doristine Church Weeks in Treatment: 0 Diagnosis Coding ICD-10 Codes Code Description [phone] Pressure ulcer of left heel, unstageable L89.610 Pressure ulcer of right heel,  unstageable L89.523 Pressure ulcer of left ankle, stage 3 G30.9 Alzheimer's disease, unspecified Facility Procedures CPT4 Code: [id number] Description: [phone] - WOUND CARE VISIT-LEV 5 EST PT Modifier: Quantity: 1 Physician Procedures : CPT4 Code Description Modifier [id number] WC PHYS LEVEL 3 NEW PT ICD-10 Diagnosis Description L89.620 Pressure ulcer of left heel, unstageable L89.610 Pressure ulcer of right heel, unstageable L89.523 Pressure ulcer of left ankle, stage 3 G30.9  Alzheimer's disease, unspecified Quantity: 1 Electronic Signature(s) Signed: 05/08/2020 4:51:06 PM By: Baltazar Najjar MD Signed: 05/08/2020 5:01:36 PM By: Shawn Stall Entered By: Shawn Stall on 05/08/2020 15:45:40

## 2020-06-05 ENCOUNTER — Other Ambulatory Visit: Payer: Self-pay

## 2020-06-05 ENCOUNTER — Non-Acute Institutional Stay: Payer: Medicare Other | Admitting: Nurse Practitioner

## 2020-06-05 DIAGNOSIS — Z515 Encounter for palliative care: Secondary | ICD-10-CM

## 2020-06-05 DIAGNOSIS — F0391 Unspecified dementia with behavioral disturbance: Secondary | ICD-10-CM

## 2020-06-05 NOTE — Progress Notes (Signed)
[facility] Consult Note Telephone: [phone]  Fax: [phone]  PATIENT NAME: Ashlee Briggs [address] [place] [address] [phone] (home)  DOB: June 12, 1928 MRN: [id number]  PRIMARY CARE PROVIDER:    Adin Hector, MD,  [address] [facility] [place] [address] [phone]  REFERRING PROVIDER:   Clide Deutscher, NP  RESPONSIBLE PARTY:   Extended Emergency Contact Information Primary Emergency Contact: Vito Backers Address: [address] [place]          [place], [place] [address] [place] [employer] Phone: [phone] Relation: Spouse Secondary Emergency Contact: [name] Mobile Phone: [phone] Relation: Daughter  I met face to face with patient and family in home/facility. Patient present but unable to substantively engage in process.   ASSESSMENT AND RECOMMENDATIONS:   Goal of care: Goals include to maximize quality of life, symptom management and for patient to be content and happy  Directives: Patient has a signed DNR on file in facility and on [facility] EMR. MOST form discussed with patient's husband. Blank copy of MOST and information material about MOST left with husband to review with family, we will complete at next visit.  2. Symptom Management: Patient with dementia (FAST 7c). Patient continues to have poor oral intake, intake 50 to 75%. Weight is  however slightly up from last visit two months ago.  Recommend continuing current regimen of giving Ensure twice a day between meals. Continue to   liberalize diet to include snacks as tolerated.  3. Follow up Palliative Care Visit: Palliative care will continue to follow for goals of care clarification and symptom management. Return in about 4 weeks or prn.  4. Family /Caregiver/Community Supports: Patient is a retired [employer] manager. Patient is Married, husband lives in [place] section of the same facility.  Husband moved to [place] so as to be closely involved in patient's care. [name] spends majority of the day with her in her memory care unit. Patient has 1 son and 1 daughter both of whom live in [place] and who visit regularly.  5. Cognitive / Functional decline: Patient dependent on staff for all of her ADls including feeding. Patient is non-ambulatory, wheelchair dependent, unable to wheel self.  I spent 48 minutes providing this consultation, time includes time spent with patient/family, chart review, and documentation. More than 50% of the time in this consultation was spent coordinating communication.   HISTORY OF PRESENT ILLNESS:  Ashlee Briggs is a 86 y.o. year old female with multiple medical problems including late onset Alzheimer's dementia, HTN, PVD, CHF. Palliative Care was asked to follow this patient to help address advance care planning, goals of care, and symptoms management. This is a follow up visit from 04/08/2020.  CODE STATUS: DNR  PPS: 30%  HOSPICE ELIGIBILITY/DIAGNOSIS: TBD  PAST MEDICAL HISTORY:  Past Medical History:  Diagnosis Date  . Hyperlipidemia   . Hypertension   . Peripheral vascular disease ([place])     SOCIAL HX:  Social History   Tobacco Use  . Smoking status: Former Smoker    Quit date: 08/14/1978    Years since quitting: 41.8  . Smokeless tobacco: Never Used  Substance Use Topics  . Alcohol use: No   FAMILY HX:  Family History  Family history unknown: Yes    ALLERGIES:  Allergies  Allergen Reactions  . Alendronate Other (See Comments)  . Risedronate Other (See Comments)  . Sulfa Antibiotics Other (See Comments)  PERTINENT MEDICATIONS:  Outpatient Encounter Medications as of 06/05/2020  Medication Sig  . acetaminophen (TYLENOL) 325 MG tablet Take 650 mg by mouth every 8 (eight) hours as needed for moderate pain.   . busPIRone (BUSPAR) 7.5 MG tablet Take 7.5 mg by mouth 2 (two) times daily as needed (anxiety).   . carvedilol  (COREG) 12.5 MG tablet Take 12.5 mg by mouth 2 (two) times daily with a meal.   . Cholecalciferol (VITAMIN D) 50 MCG (2000 UT) tablet Take 2,000 Units by mouth every evening.  . megestrol (MEGACE) 20 MG tablet Take 20 mg by mouth daily.  . melatonin 5 MG TABS Take 10 mg by mouth at bedtime.   . memantine (NAMENDA) 10 MG tablet Take 10 mg by mouth 2 (two) times daily.  . Multiple Vitamin (MULTIVITAMIN) tablet Take 1 tablet by mouth daily.  Marland Kitchen Respiratory Therapy Supplies (FLUTTER) DEVI Use 10-15 times daily  . simvastatin (ZOCOR) 40 MG tablet Take 40 mg by mouth every evening.    No facility-administered encounter medications on file as of 06/05/2020.    PHYSICAL EXAM / ROS:   Current and past weights: 98lbs up from 97lbs two months ago.  General: frail appearing, thin, sitting in wheelchair in NAD Cardiovascular: no edema, compression wraps to bilateral lower legs Pulmonary: no cough, no SOB, room air GI:  appetite fair, no report of constipation, incontinent of bowel GU:  incontinent of urine MSK:  no joint and ROM abnormalities, non-ambulatory Skin:  wounds to bilateral heels, dressing intact Neurological: Weakness, non communicative  Jari Favre, DNP, AGPCNP-BC

## 2020-07-09 DEATH — deceased
